# Patient Record
Sex: Female | Born: 1990 | Race: Black or African American | Hispanic: No | Smoking: Former smoker
Health system: Southern US, Community
[De-identification: ages and names within clinical notes are randomized; demographics above are authoritative.]

## PROBLEM LIST (undated history)

## (undated) DIAGNOSIS — Z87440 Personal history of urinary (tract) infections: Secondary | ICD-10-CM

## (undated) DIAGNOSIS — D649 Anemia, unspecified: Secondary | ICD-10-CM

## (undated) HISTORY — PX: WISDOM TOOTH EXTRACTION: SHX21

## (undated) HISTORY — PX: OTHER SURGICAL HISTORY: SHX169

---
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
## 2012-09-21 DIAGNOSIS — Z87440 Personal history of urinary (tract) infections: Secondary | ICD-10-CM

## 2012-09-21 DIAGNOSIS — D649 Anemia, unspecified: Secondary | ICD-10-CM

## 2012-09-21 HISTORY — DX: Anemia, unspecified: D64.9

## 2012-09-21 HISTORY — DX: Personal history of urinary (tract) infections: Z87.440

## 2012-10-15 ENCOUNTER — Observation Stay: Payer: Self-pay | Admitting: Obstetrics and Gynecology

## 2012-10-15 LAB — URINALYSIS, COMPLETE
Glucose,UR: NEGATIVE mg/dL (ref 0–75)
Ketone: NEGATIVE
Nitrite: NEGATIVE
Ph: 9 (ref 4.5–8.0)
Protein: 30
RBC,UR: 81 /HPF (ref 0–5)
Specific Gravity: 1.011 (ref 1.003–1.030)

## 2012-10-15 LAB — CBC WITH DIFFERENTIAL/PLATELET
Basophil %: 0.7 %
Eosinophil %: 0.2 %
HCT: 35 % (ref 35.0–47.0)
HGB: 11.7 g/dL — ABNORMAL LOW (ref 12.0–16.0)
Lymphocyte %: 11.7 %
MCHC: 33.4 g/dL (ref 32.0–36.0)
MCV: 90 fL (ref 80–100)
Monocyte %: 5.8 %
Neutrophil #: 8.9 10*3/uL — ABNORMAL HIGH (ref 1.4–6.5)
Platelet: 241 10*3/uL (ref 150–440)
RBC: 3.87 10*6/uL (ref 3.80–5.20)
RDW: 12.4 % (ref 11.5–14.5)
WBC: 10.9 10*3/uL (ref 3.6–11.0)

## 2012-10-20 LAB — URINE CULTURE

## 2013-01-10 ENCOUNTER — Observation Stay: Payer: Self-pay | Admitting: Obstetrics and Gynecology

## 2013-02-14 ENCOUNTER — Inpatient Hospital Stay: Payer: Self-pay

## 2013-02-14 LAB — CBC WITH DIFFERENTIAL/PLATELET
Basophil #: 0.1 10*3/uL (ref 0.0–0.1)
Eosinophil %: 0.1 %
HGB: 12.2 g/dL (ref 12.0–16.0)
Lymphocyte %: 11.8 %
MCH: 28 pg (ref 26.0–34.0)
MCHC: 33.5 g/dL (ref 32.0–36.0)
Neutrophil %: 83 %
Platelet: 215 10*3/uL (ref 150–440)
RBC: 4.35 10*6/uL (ref 3.80–5.20)
RDW: 16 % — ABNORMAL HIGH (ref 11.5–14.5)

## 2013-02-15 LAB — HEMATOCRIT: HCT: 28.8 % — ABNORMAL LOW (ref 35.0–47.0)

## 2014-08-17 ENCOUNTER — Emergency Department: Payer: Self-pay | Admitting: Emergency Medicine

## 2015-01-29 NOTE — H&P (Signed)
L&D Evaluation:  History Expanded:   HPI 24 yo G1P0 at 23 weeks with pain that started at 1300 in RIght Side ans hs persisted  and radiated to right back, severe at times, assoc w urinary frequency.  No n/v/d, CP, SOB, contractions, vag bleeding, or other complaints. [facility] without complication. No h/o recent or recurrent UTI.    Gravida 1    Term 0    Blood Type (Maternal) O positive    EDC 09-Feb-2013    Presents with back pain    Patient's Medical History No Chronic Illness    Patient's Surgical History none    Medications Pre Natal Vitamins    Allergies NKDA    Social History none    Family History Non-Contributory   ROS:   ROS All systems were reviewed.  HEENT, CNS, GI, GU, Respiratory, CV, Renal and Musculoskeletal systems were found to be normal.   Exam:   Vital Signs stable    General no apparent distress    Mental Status clear    Chest clear    Heart normal sinus rhythm, no murmur/gallop/rubs    Abdomen gravid, non-tender    Estimated Fetal Weight Average for gestational age    Back CVAT, (RIGHT)    Edema no edema    FHT normal rate with no decels    Ucx absent    Skin dry   Impression:   Impression PYELONEPHRITIS vs NEPHROLITHIASIS   Plan:   Plan Admit to treat.    Comments 1. IV ABX until no fever or CVAT, then po ABX. 2. ANalgesia. 3. Ultrasound in am to assess kidney function. 4. Risks of stones discussed. 5. Daily FHR measurements. 6. Risks of pyelo in pregnncy, including worsening infection, pneumonia, and death discussed with patient.   Electronic Signatures: Letitia LibraHarris, Niko Jakel Paul (MD)  (Signed 25-Jan-14 22:53)  Authored: L&D Evaluation   Last Updated: 25-Jan-14 22:53 by Letitia LibraHarris, Severus Brodzinski Paul (MD)

## 2015-01-29 NOTE — H&P (Signed)
L&D Evaluation:  History Expanded:  HPI 24 yo G1 w estimated date of confinement 02/09/13 w contractions.  No vaginal bleeding or ROM.  P[facility] Care at [facility].  O+.  Group B Beta Strep. +.   Gravida 1   Term 0   PreTerm 0   Abortion 0   Living 0   Blood Type (Maternal) O positive   Group B Strep Results Maternal (Result >5wks must be treated as unknown) positive   Maternal HIV Negative   Maternal Syphilis Ab Nonreactive   Maternal Varicella Immune   Rubella Results (Maternal) immune   Presents with contractions   Patient's Medical History No Chronic Illness   Patient's Surgical History none   Medications Pre Natal Vitamins   Allergies NKDA   Social History none   Family History Non-Contributory   ROS:  ROS All systems were reviewed.  HEENT, CNS, GI, GU, Respiratory, CV, Renal and Musculoskeletal systems were found to be normal.   Exam:  Vital Signs stable   General no apparent distress   Mental Status clear   Chest clear   Heart normal sinus rhythm, no murmur/gallop/rubs   Abdomen gravid, non-tender   Estimated Fetal Weight Average for gestational age   Back no CVAT   Edema no edema   Pelvic no external lesions, 5/BBOW   Mebranes Intact   FHT normal rate with no decels   Ucx regular   Skin dry   Impression:  Impression active labor   Plan:  Plan EFM/NST, monitor contractions and for cervical change, antibiotics for GBBS prophylaxis   Electronic Signatures: Letitia LibraHarris, Alecia Doi Paul (MD)  (Signed 27-May-14 07:19)  Authored: L&D Evaluation   Last Updated: 27-May-14 07:19 by Letitia LibraHarris, Jeanette Moffatt Paul (MD)

## 2016-01-09 LAB — HM HIV SCREENING LAB: HM HIV Screening: NEGATIVE

## 2016-01-09 LAB — HM PAP SMEAR: HM Pap smear: NEGATIVE

## 2017-09-24 ENCOUNTER — Emergency Department
Admission: EM | Admit: 2017-09-24 | Discharge: 2017-09-24 | Disposition: A | Discharge status: Home / Self Care | Payer: Self-pay | Attending: Student in an Organized Health Care Education/Training Program | Admitting: Student in an Organized Health Care Education/Training Program

## 2017-09-24 ENCOUNTER — Other Ambulatory Visit: Payer: Self-pay

## 2017-09-24 ENCOUNTER — Encounter: Payer: Self-pay | Admitting: Emergency Medicine

## 2017-09-24 DIAGNOSIS — J09X2 Influenza due to identified novel influenza A virus with other respiratory manifestations: Secondary | ICD-10-CM | POA: Insufficient documentation

## 2017-09-24 DIAGNOSIS — J101 Influenza due to other identified influenza virus with other respiratory manifestations: Secondary | ICD-10-CM

## 2017-09-24 DIAGNOSIS — F17200 Nicotine dependence, unspecified, uncomplicated: Secondary | ICD-10-CM | POA: Insufficient documentation

## 2017-09-24 LAB — URINALYSIS, COMPLETE (UACMP) WITH MICROSCOPIC
BACTERIA UA: NONE SEEN
Bilirubin Urine: NEGATIVE
GLUCOSE, UA: NEGATIVE mg/dL
Hgb urine dipstick: NEGATIVE
Ketones, ur: 80 mg/dL — AB
LEUKOCYTES UA: NEGATIVE
Nitrite: NEGATIVE
PROTEIN: 30 mg/dL — AB
SPECIFIC GRAVITY, URINE: 1.025 (ref 1.005–1.030)
pH: 7 (ref 5.0–8.0)

## 2017-09-24 LAB — INFLUENZA PANEL BY PCR (TYPE A & B)
Influenza A By PCR: POSITIVE — AB
Influenza B By PCR: NEGATIVE

## 2017-09-24 LAB — POCT PREGNANCY, URINE: Preg Test, Ur: NEGATIVE

## 2017-09-24 MED ORDER — PSEUDOEPH-BROMPHEN-DM 30-2-10 MG/5ML PO SYRP: 10.0000 mL | ORAL_SOLUTION | Freq: Four times a day (QID) | ORAL | 0 refills | Status: DC | PRN

## 2017-09-24 MED ORDER — ONDANSETRON 4 MG PO TBDP: 4.0000 mg | ORAL_TABLET | Freq: Three times a day (TID) | ORAL | 0 refills | Status: DC | PRN

## 2017-09-24 MED ORDER — ACETAMINOPHEN 325 MG PO TABS
650.0000 mg | ORAL_TABLET | Freq: Once | ORAL | Status: AC | PRN
  Administered 2017-09-24: 650 mg via ORAL
  Filled 2017-09-24: qty 2

## 2017-09-24 MED ORDER — FLUTICASONE PROPIONATE 50 MCG/ACT NA SUSP: 1.0000 | Freq: Two times a day (BID) | NASAL | 0 refills | Status: DC

## 2017-09-24 NOTE — ED Provider Notes (Signed)
[facility] Emergency Department Provider Note  ____________________________________________  Time seen: Approximately 3:39 PM  I have reviewed the triage vital signs and the nursing notes.   HISTORY  Chief Complaint flu like symptoms    HPI Cathy Vaughn is a 27 y.o. female who presents emergency department complaining of headache, sore throat, body aches, nausea, emesis, abdominal cramping starting yesterday.  Patient reports that she has a slightly runny nose but no significant nasal congestion.  She does have a slight cough.  Patient denies any visual changes, neck pain or stiffness, chest pain, shortness of breath.  No diarrhea or constipation at this time.  Patient denies any dysuria, polyuria, hematuria.  Patient does not take any medications for this complaint.  No other complaints at this time.  History reviewed. No pertinent past medical history.  There are no active problems to display for this patient.   History reviewed. No pertinent surgical history.  Prior to Admission medications   Medication Sig Start Date End Date Taking? Authorizing Provider  brompheniramine-pseudoephedrine-DM 30-2-10 MG/5ML syrup Take 10 mLs by mouth 4 (four) times daily as needed. 09/24/17   Cuthriell, Delorise Royals, PA-C  fluticasone (FLONASE) 50 MCG/ACT nasal spray Place 1 spray into both nostrils 2 (two) times daily. 09/24/17   Cuthriell, Delorise Royals, PA-C  ondansetron (ZOFRAN-ODT) 4 MG disintegrating tablet Take 1 tablet (4 mg total) by mouth every 8 (eight) hours as needed for nausea or vomiting. 09/24/17   Cuthriell, Delorise Royals, PA-C    Allergies Patient has no known allergies.  History reviewed. No pertinent family history.  Social History Social History   Tobacco Use  . Smoking status: Current Every Day Smoker  . Smokeless tobacco: Never Used  Substance Use Topics  . Alcohol use: Yes    Frequency: Never  . Drug use: No     Review of Systems   Constitutional: Positive fever/chills Eyes: No visual changes. No discharge ENT: Positive for mild and sore throat. Cardiovascular: no chest pain. Respiratory: Positive cough. No SOB. Gastrointestinal: No abdominal pain.  Positive for abdominal cramps.  Positive for nausea and emesis.  No diarrhea.  No constipation. Genitourinary: Negative for dysuria. No hematuria Musculoskeletal: Negative for musculoskeletal pain. Skin: Negative for rash, abrasions, lacerations, ecchymosis. Neurological: Negative for headaches, focal weakness or numbness. 10-point ROS otherwise negative.  ____________________________________________   PHYSICAL EXAM:  VITAL SIGNS: ED Triage Vitals  Enc Vitals Group     BP 09/24/17 1334 (!) 112/58     Pulse Rate 09/24/17 1334 (!) 110     Resp 09/24/17 1334 20     Temp 09/24/17 1334 (!) 101.5 F (38.6 C)     Temp Source 09/24/17 1334 Oral     SpO2 09/24/17 1334 100 %     Weight 09/24/17 1333 110 lb (49.9 kg)     Height 09/24/17 1333 5\' 5"  (1.651 m)     Head Circumference --      Peak Flow --      Pain Score 09/24/17 1332 9     Pain Loc --      Pain Edu? --      Excl. in GC? --      Constitutional: Alert and oriented. Well appearing and in no acute distress. Eyes: Conjunctivae are normal. PERRL. EOMI. Head: Atraumatic. ENT:      Ears: EACs and TMs unremarkable bilaterally.      Nose: Minimal clear congestion/rhinnorhea.      Mouth/Throat: Mucous membranes are moist.  Oropharynx  is mildly erythematous but nonedematous.  Uvula is midline. Neck: No stridor.  Neck is supple full range of motion Hematological/Lymphatic/Immunilogical: No cervical lymphadenopathy. Cardiovascular: Normal rate, regular rhythm. Normal S1 and S2.  Good peripheral circulation. Respiratory: Normal respiratory effort without tachypnea or retractions. Lungs CTAB. Good air entry to the bases with no decreased or absent breath sounds. Gastrointestinal: Bowel sounds 4 quadrants. Soft  and nontender to palpation. No guarding or rigidity. No palpable masses. No distention. No CVA tenderness. Musculoskeletal: Full range of motion to all extremities. No gross deformities appreciated. Neurologic:  Normal speech and language. No gross focal neurologic deficits are appreciated.  Skin:  Skin is warm, dry and intact. No rash noted. Psychiatric: Mood and affect are normal. Speech and behavior are normal. Patient exhibits appropriate insight and judgement.   ____________________________________________   LABS (all labs ordered are listed, but only abnormal results are displayed)  Labs Reviewed  INFLUENZA PANEL BY PCR (TYPE A & B) - Abnormal; Notable for the following components:      Result Value   Influenza A By PCR POSITIVE (*)    All other components within normal limits  URINALYSIS, COMPLETE (UACMP) WITH MICROSCOPIC - Abnormal; Notable for the following components:   Color, Urine YELLOW (*)    APPearance HAZY (*)    Ketones, ur 80 (*)    Protein, ur 30 (*)    Squamous Epithelial / LPF 0-5 (*)    All other components within normal limits  POCT PREGNANCY, URINE  POC URINE PREG, ED   ____________________________________________  EKG   ____________________________________________  RADIOLOGY   No results found.  ____________________________________________    PROCEDURES  Procedure(s) performed:    Procedures    Medications  acetaminophen (TYLENOL) tablet 650 mg (650 mg Oral Given 09/24/17 1356)     ____________________________________________   INITIAL IMPRESSION / ASSESSMENT AND PLAN / ED COURSE  Pertinent labs & imaging results that were available during my care of the patient were reviewed by me and considered in my medical decision making (see chart for details).  Review of the [place] CSRS was performed in accordance of the [facility] prior to dispensing any controlled drugs.  Clinical Course as of Sep 25 1631  [name] Sep 24, 2017  1541 Patient  presents the emergency department with 1 day history of fevers and chills, nasal congestion, sore throat, coughing, nausea and emesis, abdominal cramping.  Initial differential includes influenza versus neurovirus versus upper respiratory viral infection versus UTI.  Influenza testing is ordered.  Urinalysis ordered.  [JC]    Clinical Course User Index [JC] Cuthriell, Delorise Royals, PA-C    Patient's diagnosis is consistent with influenza.  Patient presented with fevers, chills, nasal congestion, sore throat, nausea and vomiting.  Patient was positive for influenza A on testing.  After discussion about Tamiflu, patient declines.. Patient will be discharged home with prescriptions for Zofran, Flonase, Bromfed.  Patient is to drink plenty of fluids, take Tylenol Motrin at home.. Patient is to follow up with primary care as needed or otherwise directed. Patient is given ED precautions to return to the ED for any worsening or new symptoms.     ____________________________________________  FINAL CLINICAL IMPRESSION(S) / ED DIAGNOSES  Final diagnoses:  Influenza A      NEW MEDICATIONS STARTED DURING THIS VISIT:  ED Discharge Orders        Ordered    ondansetron (ZOFRAN-ODT) 4 MG disintegrating tablet  Every 8 hours PRN     09/24/17 1630  fluticasone (FLONASE) 50 MCG/ACT nasal spray  2 times daily     09/24/17 1630    brompheniramine-pseudoephedrine-DM 30-2-10 MG/5ML syrup  4 times daily PRN     09/24/17 1630          This chart was dictated using voice recognition software/Dragon. Despite best efforts to proofread, errors can occur which can change the meaning. Any change was purely unintentional.    Racheal PatchesCuthriell, Jonathan D, PA-C 09/24/17 1634    Willy Eddyobinson, Patrick, MD 09/24/17 [id number]

## 2017-09-24 NOTE — ED Notes (Addendum)
Sore throat, headache, body aches that started last night. Denies nasal congestion or productive cough. Pt alert and oriented X4, active, cooperative, pt in NAD. RR even and unlabored, color WNL.

## 2017-09-24 NOTE — ED Triage Notes (Signed)
Generalized body aches since yesterday with cough and sore throat. No fever. Not taken anything OTC.

## 2017-09-24 NOTE — ED Notes (Signed)
Pt alert and oriented X4, active, cooperative, pt in NAD. RR even and unlabored, color WNL.  Pt informed to return if any life threatening symptoms occur.  Discharge and followup instructions reviewed.  

## 2019-04-09 ENCOUNTER — Other Ambulatory Visit: Payer: Self-pay

## 2019-04-09 DIAGNOSIS — F17200 Nicotine dependence, unspecified, uncomplicated: Secondary | ICD-10-CM | POA: Diagnosis not present

## 2019-04-09 DIAGNOSIS — Z79899 Other long term (current) drug therapy: Secondary | ICD-10-CM | POA: Diagnosis not present

## 2019-04-09 DIAGNOSIS — O210 Mild hyperemesis gravidarum: Secondary | ICD-10-CM | POA: Diagnosis not present

## 2019-04-09 DIAGNOSIS — O99331 Smoking (tobacco) complicating pregnancy, first trimester: Secondary | ICD-10-CM | POA: Insufficient documentation

## 2019-04-09 DIAGNOSIS — K226 Gastro-esophageal laceration-hemorrhage syndrome: Secondary | ICD-10-CM | POA: Diagnosis not present

## 2019-04-09 DIAGNOSIS — O9989 Other specified diseases and conditions complicating pregnancy, childbirth and the puerperium: Secondary | ICD-10-CM | POA: Diagnosis not present

## 2019-04-09 DIAGNOSIS — Z3A08 8 weeks gestation of pregnancy: Secondary | ICD-10-CM | POA: Diagnosis not present

## 2019-04-09 LAB — URINALYSIS, COMPLETE (UACMP) WITH MICROSCOPIC
Bilirubin Urine: NEGATIVE
Glucose, UA: NEGATIVE mg/dL
Hgb urine dipstick: NEGATIVE
Ketones, ur: 20 mg/dL — AB
Nitrite: NEGATIVE
Protein, ur: NEGATIVE mg/dL
Specific Gravity, Urine: 1.014 (ref 1.005–1.030)
pH: 6 (ref 5.0–8.0)

## 2019-04-09 LAB — COMPREHENSIVE METABOLIC PANEL
ALT: 23 U/L (ref 0–44)
AST: 25 U/L (ref 15–41)
Albumin: 4.7 g/dL (ref 3.5–5.0)
Alkaline Phosphatase: 50 U/L (ref 38–126)
Anion gap: 10 (ref 5–15)
BUN: 9 mg/dL (ref 6–20)
CO2: 22 mmol/L (ref 22–32)
Calcium: 9.3 mg/dL (ref 8.9–10.3)
Chloride: 103 mmol/L (ref 98–111)
Creatinine, Ser: 0.43 mg/dL — ABNORMAL LOW (ref 0.44–1.00)
GFR calc Af Amer: >60 mL/min (ref >60)
GFR calc non Af Amer: >60 mL/min (ref >60)
Glucose, Bld: 75 mg/dL (ref 70–99)
Potassium: 3.3 mmol/L — ABNORMAL LOW (ref 3.5–5.1)
Sodium: 135 mmol/L (ref 135–145)
Total Bilirubin: 0.7 mg/dL (ref 0.3–1.2)
Total Protein: 7.6 g/dL (ref 6.5–8.1)

## 2019-04-09 LAB — CBC
HCT: 39.1 % (ref 36.0–46.0)
Hemoglobin: 13.6 g/dL (ref 12.0–15.0)
MCH: 30.8 pg (ref 26.0–34.0)
MCHC: 34.8 g/dL (ref 30.0–36.0)
MCV: 88.5 fL (ref 80.0–100.0)
Platelets: 286 10*3/uL (ref 150–400)
RBC: 4.42 MIL/uL (ref 3.87–5.11)
RDW: 11.9 % (ref 11.5–15.5)
WBC: 8.4 10*3/uL (ref 4.0–10.5)
nRBC: 0 % (ref 0.0–0.2)

## 2019-04-09 LAB — POCT PREGNANCY, URINE: Preg Test, Ur: POSITIVE — AB

## 2019-04-09 LAB — LIPASE, BLOOD: Lipase: 34 U/L (ref 11–51)

## 2019-04-09 NOTE — ED Triage Notes (Signed)
Patient reports being approximately [redacted] weeks pregnant.  States has had "usual" vomiting with pregnancy, but over past couple days more fatigued than usual, vomiting had increased and stated vomited blood approximately an hour ago.

## 2019-04-10 ENCOUNTER — Emergency Department
Admission: EM | Admit: 2019-04-10 | Discharge: 2019-04-10 | Disposition: A | Discharge status: Home / Self Care | Payer: Medicaid Other | Attending: Emergency Medicine | Admitting: Emergency Medicine

## 2019-04-10 DIAGNOSIS — K226 Gastro-esophageal laceration-hemorrhage syndrome: Secondary | ICD-10-CM

## 2019-04-10 DIAGNOSIS — O210 Mild hyperemesis gravidarum: Secondary | ICD-10-CM

## 2019-04-10 MED ORDER — DEXTROSE IN LACTATED RINGERS 5 % IV SOLN
INTRAVENOUS | Status: DC
  Administered 2019-04-10: 01:00:00 via INTRAVENOUS

## 2019-04-10 MED ORDER — DEXTROSE 5 % AND 0.9 % NACL IV BOLUS: 1000.0000 mL | Freq: Once | INTRAVENOUS | Status: DC

## 2019-04-10 MED ORDER — DEXTROSE-NACL 5-0.9 % IV SOLN: Freq: Once | INTRAVENOUS | Status: DC

## 2019-04-10 MED ORDER — PROMETHAZINE HCL 25 MG/ML IJ SOLN
12.5000 mg | Freq: Once | INTRAMUSCULAR | Status: AC
  Administered 2019-04-10: 01:00:00 12.5 mg via INTRAVENOUS

## 2019-04-10 MED ORDER — ONDANSETRON 4 MG PO TBDP: ORAL_TABLET | ORAL | 0 refills | Status: DC

## 2019-04-10 MED ORDER — ONDANSETRON HCL 4 MG/2ML IJ SOLN
4.0000 mg | INTRAMUSCULAR | Status: AC
  Administered 2019-04-10: 03:00:00 4 mg via INTRAVENOUS
  Filled 2019-04-10: qty 2

## 2019-04-10 NOTE — ED Notes (Signed)
Responded to call bell. Pt requested warm blanket and pillow. Pillow and blanket provided. Pt resting comfortably.

## 2019-04-10 NOTE — Discharge Instructions (Addendum)
You were evaluated today for nausea and vomiting during pregnancy.  It is safe for you to go home and follow-up as an outpatient since you are feeling better.  Please take your regular medications (unless otherwise canceled in these papers) and any medications prescribed today according to the written instructions.  Return to the emergency department if you develop any new or worsening symptoms that concern you.  

## 2019-04-10 NOTE — ED Notes (Signed)
Pt states she is not feeling improved. md notified.

## 2019-04-10 NOTE — ED Provider Notes (Signed)
[facility] Emergency Department Provider Note  ____________________________________________   First MD Initiated Contact with Patient 04/10/19 0019     (approximate)  I have reviewed the triage vital signs and the nursing notes.   HISTORY  Chief Complaint Emesis    HPI Cathy Vaughn is a 28 y.o. female G2, P1 at approximately [redacted] weeks gestation based on the date of her last menstrual period.  She presents tonight for persistent vomiting over the last couple of days it is gradually gotten worse.  She said that she has had nausea and vomiting throughout the pregnancy thus far but is gotten worse over the last few days and she feels generally weak and a little bit lightheaded as well, she thinks because she has not been able to keep anything down recently.  This evening she was throwing up quite violently, doing a lot of retching and gagging, and she noticed blood mixed in with the frothy material that was coming out.  She vomited again subsequently a few hours later and only a little bit of blood at this time but it scared her.  She has no history of alcohol abuse and no bleeding or clotting disorders of which she is aware.  She denies fever, sore throat until all the vomiting occurred and now her throat is little bit raw, chest pain, cough, shortness of breath.  Her stomach feels a little bit achy after the vomiting but she is not having any abdominal pain per se.  She denies vaginal bleeding.  She has not vomited for a couple of hours.  She describes the symptoms as severe and gradually getting worse over time.  She did not have a similar experience with her first pregnancy.  Nothing particular makes the symptoms better or worse.  She plans to establish care at the health department within the next week and also is planning to go to [place] because she works in [place].         No past medical history on file.  There are no active problems to  display for this patient.   No past surgical history on file.  Prior to Admission medications   Medication Sig Start Date End Date Taking? Authorizing Provider  brompheniramine-pseudoephedrine-DM 30-2-10 MG/5ML syrup Take 10 mLs by mouth 4 (four) times daily as needed. 09/24/17   Cuthriell, Charline Bills, PA-C  fluticasone (FLONASE) 50 MCG/ACT nasal spray Place 1 spray into both nostrils 2 (two) times daily. 09/24/17   Cuthriell, Charline Bills, PA-C  ondansetron (ZOFRAN-ODT) 4 MG disintegrating tablet Take 1 tablet (4 mg total) by mouth every 8 (eight) hours as needed for nausea or vomiting. 09/24/17   Cuthriell, Charline Bills, PA-C    Allergies Patient has no known allergies.  Family History  Problem Relation Age of Onset  . Hypertension Mother   . Alcohol abuse Father   . Diabetes Maternal Grandmother   . Diabetes Maternal Grandfather   . Lung cancer Maternal Grandfather     Social History Social History   Tobacco Use  . Smoking status: Current Every Day Smoker  . Smokeless tobacco: Never Used  Substance Use Topics  . Alcohol use: Yes    Frequency: Never  . Drug use: No    Review of Systems Constitutional: General fatigue and malaise.  No fever/chills Eyes: No visual changes. ENT: No sore throat. Cardiovascular: Denies chest pain. Respiratory: Denies shortness of breath. Gastrointestinal: Worsening nausea and vomiting with some blood mixed with her emesis  this evening.  Aching abdominal pain after multiple episodes of vomiting but that sharp pain. Genitourinary: No vaginal bleeding.  Negative for dysuria. Musculoskeletal: Negative for neck pain.  Negative for back pain. Integumentary: Negative for rash. Neurological: Negative for headaches, focal weakness or numbness.   ____________________________________________   PHYSICAL EXAM:  VITAL SIGNS: ED Triage Vitals  Enc Vitals Group     BP 04/09/19 1926 (!) 102/51     Pulse Rate 04/09/19 1926 84     Resp 04/09/19 1926 18      Temp 04/09/19 1926 99 F (37.2 C)     Temp Source 04/09/19 1926 Oral     SpO2 04/09/19 1926 100 %     Weight 04/09/19 1927 49.9 kg (110 lb)     Height 04/09/19 1927 1.626 m (5\' 4" )     Head Circumference --      Peak Flow --      Pain Score 04/09/19 1927 6     Pain Loc --      Pain Edu? --      Excl. in GC? --     Constitutional: Alert and oriented. Well appearing and in no acute distress. Eyes: Conjunctivae are normal.  Head: Atraumatic. Nose: No congestion/rhinnorhea. Mouth/Throat: Mucous membranes are moist. Neck: No stridor.  No meningeal signs.   Cardiovascular: Normal rate, regular rhythm. Good peripheral circulation. Grossly normal heart sounds. Respiratory: Normal respiratory effort.  No retractions. No audible wheezing. Gastrointestinal: Soft and nontender. No distention.  GU:  deferred - not indicated Musculoskeletal: No lower extremity tenderness nor edema. No gross deformities of extremities. Neurologic:  Normal speech and language. No gross focal neurologic deficits are appreciated.  Skin:  Skin is warm, dry and intact. No rash noted. Psychiatric: Mood and affect are normal. Speech and behavior are normal.  ____________________________________________   LABS (all labs ordered are listed, but only abnormal results are displayed)  Labs Reviewed  COMPREHENSIVE METABOLIC PANEL - Abnormal; Notable for the following components:      Result Value   Potassium 3.3 (*)    Creatinine, Ser 0.43 (*)    All other components within normal limits  URINALYSIS, COMPLETE (UACMP) WITH MICROSCOPIC - Abnormal; Notable for the following components:   Color, Urine YELLOW (*)    APPearance HAZY (*)    Ketones, ur 20 (*)    Leukocytes,Ua SMALL (*)    Bacteria, UA RARE (*)    All other components within normal limits  POCT PREGNANCY, URINE - Abnormal; Notable for the following components:   Preg Test, Ur POSITIVE (*)    All other components within normal limits  LIPASE, BLOOD   CBC  POC URINE PREG, ED   ____________________________________________  EKG  No indication for EKG ____________________________________________  RADIOLOGY   ED MD interpretation: Indication for imaging  Official radiology report(s): No results found.  ____________________________________________   PROCEDURES   Procedure(s) performed (including Critical Care):  Procedures   ____________________________________________   INITIAL IMPRESSION / MDM / ASSESSMENT AND PLAN / ED COURSE  As part of my medical decision making, I reviewed the following data within the electronic MEDICAL RECORD NUMBER Nursing notes reviewed and incorporated, Labs reviewed , Old chart reviewed and Notes from prior ED visits   Differential diagnosis includes, but is not limited to, hyperemesis gravidarum, Mallory-Weiss tear, less likely esophageal varices or acute intra-abdominal infection.  The patient is well-appearing in no distress.  Vital signs are stable and she is not tachycardic.  She has some ketones  in her urine which I would expect and her potassium is very slightly low.  Otherwise lab work is reassuring and urine pregnancy test is positive as expected.  She has no GU complaints at this time and there is no indication for an ultrasound.  We discussed it and decided that I would give her some IV fluids I think it would be appropriate to give D5 normal saline 1 L over an hour by IV as well as Phenergan 12.5 mg IV.  I think this will be more helpful to her in terms of controlling her nausea and may also help her get some rest.  Anticipate discharge with OB/GYN follow-up and a prescription for antiemetics.  She understands and agrees with this plan.  No indication for imaging at this time.  I explained to her about Mallory-Weiss tears in the setting of forceful vomiting and she understands.  She is not at high risk for esophageal varices.      Clinical Course as of Apr 09 242  Mon Apr 10, 2019  [id number]  Patient feels better, no additional vomiting.  She is tired and ready to go home.  I gave my usual customary return precautions and a prescription for Zofran.   [CF]    Clinical Course User Index [CF] Loleta RoseForbach, Lyriq Jarchow, MD     ____________________________________________  FINAL CLINICAL IMPRESSION(S) / ED DIAGNOSES  Final diagnoses:  Hyperemesis gravidarum  Mallory-Weiss syndrome     MEDICATIONS GIVEN DURING THIS VISIT:  Medications  dextrose 5 % in lactated ringers infusion ( Intravenous New Bag/Given 04/10/19 0116)  ondansetron (ZOFRAN) injection 4 mg (has no administration in time range)  promethazine (PHENERGAN) injection 12.5 mg (12.5 mg Intravenous Given 04/10/19 0114)     ED Discharge Orders    None      *Please note:  Lynnae SandhoffBrianna Boughner was evaluated in Emergency Department on 04/10/2019 for the symptoms described in the history of present illness. She was evaluated in the context of the global COVID-19 pandemic, which necessitated consideration that the patient might be at risk for infection with the SARS-CoV-2 virus that causes COVID-19. Institutional protocols and algorithms that pertain to the evaluation of patients at risk for COVID-19 are in a state of rapid change based on information released by regulatory bodies including the CDC and federal and state organizations. These policies and algorithms were followed during the patient's care in the ED.  Some ED evaluations and interventions may be delayed as a result of limited staffing during the pandemic.*  Note:  This document was prepared using Dragon voice recognition software and may include unintentional dictation errors.   Loleta RoseForbach, Robbyn Hodkinson, MD 04/10/19 [phone]

## 2019-04-10 NOTE — ED Notes (Signed)
Bed repositioned for comfort, phone at side, call bell at side. Lights dimmed for comfort.

## 2019-04-10 NOTE — ED Notes (Addendum)
Pt states she is approx [redacted] weeks pregnant. Pt states she has had nausea and vomiting this pregnancy and is not able to keep fluids or food down. Pt states she feels weak and has no energy. Pt states she has been loosing weight and at times has intermittent chest heaviness. Pt states tonight while vomiting had BrB in emesis once. Pt states she is constipated.

## 2019-04-12 ENCOUNTER — Other Ambulatory Visit: Payer: Self-pay | Admitting: Family Medicine

## 2019-04-12 ENCOUNTER — Other Ambulatory Visit: Payer: Self-pay

## 2019-04-12 ENCOUNTER — Ambulatory Visit (LOCAL_COMMUNITY_HEALTH_CENTER): Payer: Self-pay

## 2019-04-12 VITALS — BP 106/67 | Ht 62.0 in | Wt 110.0 lb

## 2019-04-12 DIAGNOSIS — Z3201 Encounter for pregnancy test, result positive: Secondary | ICD-10-CM

## 2019-04-12 LAB — PREGNANCY, URINE: Preg Test, Ur: POSITIVE — AB

## 2019-04-12 MED ORDER — PRENATAL VITAMIN 27-0.8 MG PO TABS: 1.0000 | ORAL_TABLET | Freq: Every day | ORAL | 0 refills | Status: AC

## 2019-04-18 ENCOUNTER — Encounter: Payer: Self-pay | Admitting: Emergency Medicine

## 2019-04-18 ENCOUNTER — Emergency Department: Payer: Medicaid Other

## 2019-04-18 ENCOUNTER — Emergency Department
Admission: EM | Admit: 2019-04-18 | Discharge: 2019-04-18 | Disposition: A | Discharge status: Home / Self Care | Payer: Medicaid Other | Attending: Emergency Medicine | Admitting: Emergency Medicine

## 2019-04-18 ENCOUNTER — Other Ambulatory Visit: Payer: Self-pay

## 2019-04-18 DIAGNOSIS — O9A219 Injury, poisoning and certain other consequences of external causes complicating pregnancy, unspecified trimester: Secondary | ICD-10-CM | POA: Insufficient documentation

## 2019-04-18 DIAGNOSIS — Y999 Unspecified external cause status: Secondary | ICD-10-CM | POA: Diagnosis not present

## 2019-04-18 DIAGNOSIS — Z3A00 Weeks of gestation of pregnancy not specified: Secondary | ICD-10-CM | POA: Insufficient documentation

## 2019-04-18 DIAGNOSIS — Y92009 Unspecified place in unspecified non-institutional (private) residence as the place of occurrence of the external cause: Secondary | ICD-10-CM | POA: Insufficient documentation

## 2019-04-18 DIAGNOSIS — Z87891 Personal history of nicotine dependence: Secondary | ICD-10-CM | POA: Diagnosis not present

## 2019-04-18 DIAGNOSIS — S62666A Nondisplaced fracture of distal phalanx of right little finger, initial encounter for closed fracture: Secondary | ICD-10-CM | POA: Diagnosis not present

## 2019-04-18 DIAGNOSIS — S6991XA Unspecified injury of right wrist, hand and finger(s), initial encounter: Secondary | ICD-10-CM | POA: Diagnosis present

## 2019-04-18 DIAGNOSIS — Z79899 Other long term (current) drug therapy: Secondary | ICD-10-CM | POA: Insufficient documentation

## 2019-04-18 DIAGNOSIS — Y9389 Activity, other specified: Secondary | ICD-10-CM | POA: Diagnosis not present

## 2019-04-18 DIAGNOSIS — S90112A Contusion of left great toe without damage to nail, initial encounter: Secondary | ICD-10-CM

## 2019-04-18 NOTE — Discharge Instructions (Signed)
Call make an appoint with Dr. Harlow Mares to follow-up for your fractured fifth finger.  You may use ice and elevate to reduce swelling and help with pain.  Wear splint for protection and support.  You may take Tylenol if needed for pain.  Avoid any anti-inflammatories while being pregnant.  Wear metal splint on your finger until you have been seen by the orthopedist and advised otherwise.
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
## 2019-06-08 NOTE — Addendum Note (Signed)
Addended by: Cletis Media on: 06/08/2019 09:27 AM   Modules accepted: Orders

## 2020-11-25 IMAGING — DX LEFT GREAT TOE
3 series · 3 of 3 positions shown · non-contrast
Comparison: No recent.

CLINICAL DATA: Injury.

EXAM:
LEFT GREAT TOE

[toe ap]
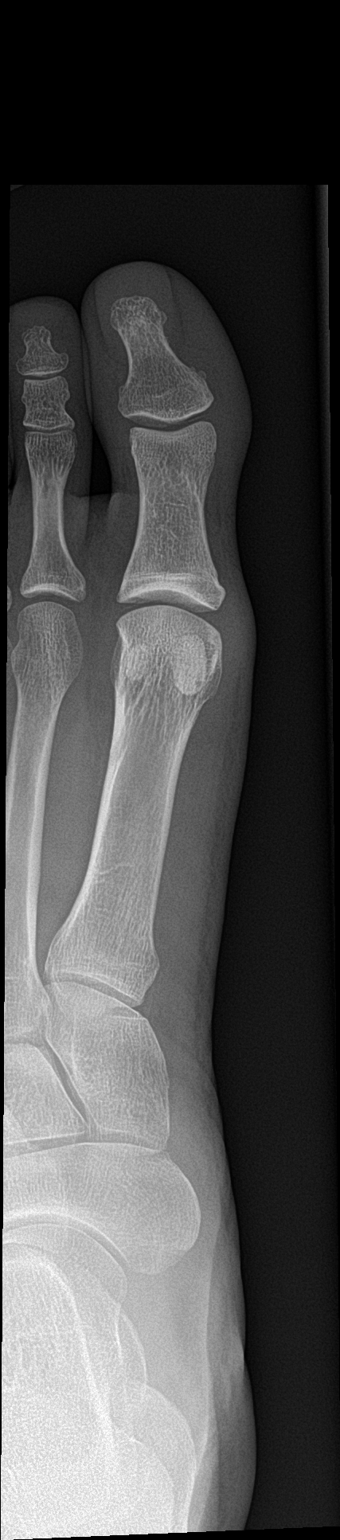

[toe obl]
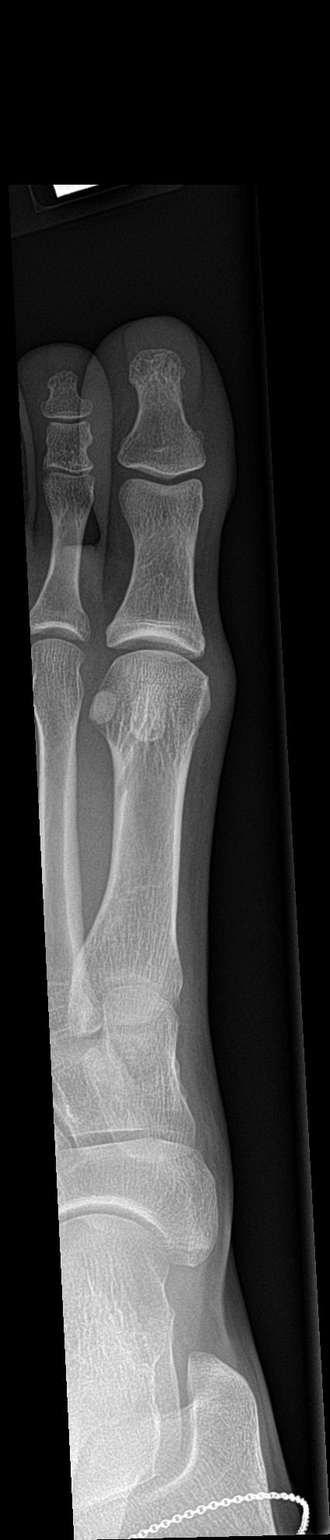

[toe lat]
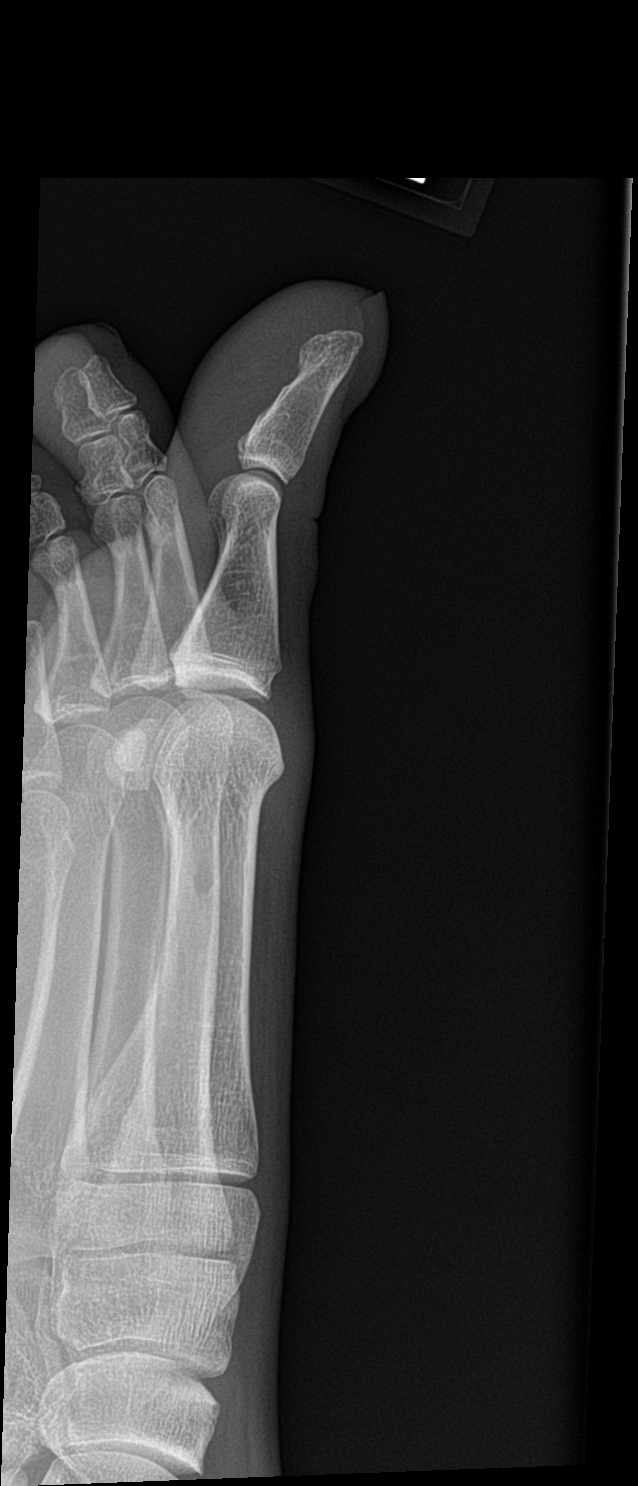

[3 of 3 positions shown; findings below may reference images not displayed]

FINDINGS: No acute soft tissue bony abnormality.  No radiopaque foreign body.
IMPRESSION: No acute or focal abnormality.
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
## 2024-03-21 ENCOUNTER — Ambulatory Visit (LOCAL_COMMUNITY_HEALTH_CENTER)

## 2024-03-21 VITALS — BP 117/56 | Ht 64.0 in | Wt 125.0 lb

## 2024-03-21 DIAGNOSIS — Z3201 Encounter for pregnancy test, result positive: Secondary | ICD-10-CM

## 2024-03-21 DIAGNOSIS — Z309 Encounter for contraceptive management, unspecified: Secondary | ICD-10-CM | POA: Diagnosis not present

## 2024-03-21 LAB — PREGNANCY, URINE: Preg Test, Ur: POSITIVE — AB

## 2024-03-21 MED ORDER — PRENATAL 27-0.8 MG PO TABS: 1.0000 | ORAL_TABLET | Freq: Every day | ORAL | Status: DC

## 2024-03-21 NOTE — Progress Notes (Signed)
 UPT positive. Plans to receive prenatal care at [facility] [place] location; encouraged to establish care ASAP. List of local providers given to patient.   The patient was dispensed prenatal vitamins #100 today. I provided counseling today regarding the medication. We discussed the medication, the side effects and when to call clinic.   Positive pregnancy packet reviewed and given to patient. Also counseled on hydration and when to seek medical attention.  All questions answered and verbalizes understanding.   Doyce CINDERELLA Shuck, RN

## 2024-04-14 ENCOUNTER — Telehealth: Payer: Self-pay | Admitting: Advanced Practice Midwife

## 2024-04-14 ENCOUNTER — Telehealth

## 2024-04-14 NOTE — Telephone Encounter (Signed)
 Reached out to pt to reschedule NOB Nurse Intake appt that was scheduled on 04/14/2024 at 3:15.  Left message for pt to call back to reschedule.

## 2024-04-17 NOTE — Telephone Encounter (Signed)
 Pt is scheduled for NOB intake on 04/24/2024 at 10:15.

## 2024-04-24 ENCOUNTER — Telehealth

## 2024-04-24 DIAGNOSIS — Z3689 Encounter for other specified antenatal screening: Secondary | ICD-10-CM

## 2024-04-24 DIAGNOSIS — Z3687 Encounter for antenatal screening for uncertain dates: Secondary | ICD-10-CM

## 2024-04-24 DIAGNOSIS — Z3480 Encounter for supervision of other normal pregnancy, unspecified trimester: Secondary | ICD-10-CM | POA: Insufficient documentation

## 2024-04-24 NOTE — Progress Notes (Signed)
 New OB Intake  I connected with  Cathy Vaughn Base on 04/24/24 at 10:15 AM EDT by MyChart Video Visit and verified that I am speaking with the correct person using two identifiers. Nurse is located at [facility] and pt is located at home.  I discussed the limitations, risks, security and privacy concerns of performing an evaluation and management service by telephone and the availability of in person appointments. I also discussed with the patient that there may be a patient responsible charge related to this service. The patient expressed understanding and agreed to proceed.  I explained I am completing New OB Intake today. We discussed her EDD of 11/26/24 that is based on LMP of 02/20/24. Pt is G5/P2. I reviewed her allergies, medications, Medical/Surgical/OB history, and appropriate screenings. There are cats in the home: no. Based on history, this is a/an pregnancy uncomplicated . Her obstetrical history is significant for N/A.  Patient Active Problem List   Diagnosis Date Noted   Supervision of other normal pregnancy, antepartum 04/24/2024    Concerns addressed today: None  Delivery Plans:  Plans to deliver at [facility].  Anatomy US  Explained first scheduled US  will be 04/26/24. Anatomy US  will be scheduled around [redacted] weeks gestational age.  Labs Discussed genetic screening with patient. Patient undecided about genetic testing to be drawn at new OB visit. Discussed possible labs to be drawn at new OB appointment.  COVID Vaccine Patient has had 2 COVID vaccines.   Social Determinants of Health Food Insecurity: expresses food insecurity. Information given on local food banks. Transportation: Patient denies transportation needs. Childcare: Discussed no children allowed at ultrasound appointments.   First visit review I reviewed new OB appt with pt. I explained she will have blood work and pap smear/pelvic exam if indicated. Explained pt will be seen by  Slater Rains, CNM at first visit; encounter routed to appropriate provider.   Beola Skeens, CMA 04/24/2024  10:39 AM

## 2024-04-24 NOTE — Patient Instructions (Signed)
 First Trimester of Pregnancy  The first trimester of pregnancy starts on the first day of your last monthly period until the end of week 13. This is months 1 through 3 of pregnancy. A week after a sperm fertilizes an egg, the egg will implant into the wall of the uterus and begin to develop into a baby. Body changes during your first trimester Your body goes through many changes during pregnancy. The changes usually return to normal after your baby is born. Physical changes Your breasts may grow larger and may hurt. The area around your nipples may get darker. Your periods will stop. Your hair and nails may grow faster. You may pee more often. Health changes You may tire easily. Your gums may bleed and may be sensitive when you brush and floss. You may not feel hungry. You may have heartburn. You may throw up or feel like you may throw up. You may want to eat some foods, but not others. You may have headaches. You may have trouble pooping (constipation). Other changes Your emotions may change from day to day. You may have more dreams. Follow these instructions at home: Medicines Talk to your health care provider if you're taking medicines. Ask if the medicines are safe to take during pregnancy. Your provider may change the medicines that you take. Do not take any medicines unless told to by your provider. Take a prenatal vitamin that has at least 600 micrograms (mcg) of folic acid. Do not use herbal medicines, illegal substances, or medicines that are not approved by your provider. Eating and drinking While you're pregnant your body needs extra food for your growing baby. Talk with your provider about what to eat while pregnant. Activity Most women are able to exercise during pregnancy. Exercises may need to change as your pregnancy goes on. Talk to your provider about your activities and exercise routines. Relieving pain and discomfort Wear a good, supportive bra if your breasts  hurt. Rest with your legs raised if you have leg cramps or low back pain. Safety Wear your seatbelt at all times when you're in a car. Talk to your provider if someone hits you, hurts you, or yells at you. Talk with your provider if you're feeling sad or have thoughts of hurting yourself. Lifestyle Certain things can be harmful while you're pregnant. Follow these rules: Do not use hot tubs, steam rooms, or saunas. Do not douche. Do not use tampons or scented pads. Do not drink alcohol,smoke, vape, or use products with nicotine or tobacco in them. If you need help quitting, talk with your provider. Avoid cat litter boxes and soil used by cats. These things carry germs that can cause harm to your pregnancy and your baby. General instructions Keep all follow-up visits. It helps you and your unborn baby stay as healthy as possible. Write down your questions. Take them to your visits. Your provider will: Talk with you about your overall health. Give you advice or refer you to specialists who can help with different needs, including: Prenatal education classes. Mental health and counseling. Foods and healthy eating. Ask for help if you need help with food. Call your dentist and ask to be seen. Brush your teeth with a soft toothbrush. Floss gently. Where to find more information American Pregnancy Association: americanpregnancy.org [employer]: acog.org Office on [employer] Health: TravelLesson.ca Contact a health care provider if: You feel dizzy, faint, or have a fever. You vomit or have watery poop (diarrhea) for 2  days or more. You have abnormal discharge or bleeding from your vagina. You have pain when you pee or your pee smells bad. You have cramps, pain, or pressure in your belly area. Get help right away if: You have trouble breathing or chest pain. You have any kind of injury, such as from a fall or a car crash. These symptoms may be an  emergency. Get help right away. Call 911. Do not wait to see if the symptoms will go away. Do not drive yourself to the hospital. This information is not intended to replace advice given to you by your health care provider. Make sure you discuss any questions you have with your health care provider. Document Revised: 06/10/2023 Document Reviewed: 01/08/2023 Elsevier Patient Education  2024 Elsevier Inc.   Common Medications Safe in Pregnancy  Acne:      Constipation:  Benzoyl Peroxide     Colace  Clindamycin      Dulcolax Suppository  Topica Erythromycin     Fibercon  Salicylic Acid      Metamucil         Miralax AVOID:        Senakot   Accutane    Cough:  Retin-A       Cough Drops  Tetracycline      Phenergan w/ Codeine if Rx  Minocycline      Robitussin (Plain & DM)  Antibiotics:     Crabs/Lice:  Ceclor       RID  Cephalosporins    AVOID:  E-Mycins      Kwell  Keflex  Macrobid/Macrodantin   Diarrhea:  Penicillin      Kao-Pectate  Zithromax      Imodium AD         PUSH FLUIDS AVOID:       Cipro     Fever:  Tetracycline      Tylenol (Regular or Extra  Minocycline       Strength)  Levaquin      Extra Strength-Do not          Exceed 8 tabs/24 hrs Caffeine:        200mg /day (equiv. To 1 cup of coffee or  approx. 3 12 oz sodas)         Gas: Cold/Hayfever:       Gas-X  Benadryl      Mylicon  Claritin       Phazyme  **Claritin-D        Chlor-Trimeton    Headaches:  Dimetapp      ASA-Free Excedrin  Drixoral-Non-Drowsy     Cold Compress  Mucinex (Guaifenasin)     Tylenol (Regular or Extra  Sudafed/Sudafed-12 Hour     Strength)  **Sudafed PE Pseudoephedrine   Tylenol Cold & Sinus     Vicks Vapor Rub  Zyrtec  **AVOID if Problems With Blood Pressure         Heartburn: Avoid lying down for at least 1 hour after meals  Aciphex      Maalox     Rash:  Milk of Magnesia     Benadryl    Mylanta       1% Hydrocortisone Cream  Pepcid  Pepcid Complete   Sleep  Aids:  Prevacid      Ambien   Prilosec       Benadryl  Rolaids       Chamomile Tea  Tums (Limit 4/day)     Unisom  Tylenol PM         Warm milk-add vanilla or  Hemorrhoids:       Sugar for taste  Anusol/Anusol H.C.  (RX: Analapram 2.5%)  Sugar Substitutes:  Hydrocortisone OTC     Ok in moderation  Preparation H      Tucks        Vaseline lotion applied to tissue with wiping    Herpes:     Throat:  Acyclovir      Oragel  Famvir  Valtrex     Vaccines:         Flu Shot Leg Cramps:       *Gardasil  Benadryl      Hepatitis A         Hepatitis B Nasal Spray:       Pneumovax  Saline Nasal Spray     Polio Booster         Tetanus Nausea:       Tuberculosis test or PPD  Vitamin B6 25 mg TID   AVOID:    Dramamine      *Gardasil  Emetrol       Live Poliovirus  Ginger Root 250 mg QID    MMR (measles, mumps &  High Complex Carbs @ Bedtime    rebella)  Sea Bands-Accupressure    Varicella (Chickenpox)  Unisom 1/2 tab TID     *No known complications           If received before Pain:         Known pregnancy;   Darvocet       Resume series after  Lortab        Delivery  Percocet    Yeast:   Tramadol      Femstat  Tylenol 3      Gyne-lotrimin  Ultram       Monistat  Vicodin           MISC:         All Sunscreens           Hair Coloring/highlights          Insect Repellant's          (Including DEET)         Mystic Tans   Commonly Asked Questions During Pregnancy   Cats: A parasite can be excreted in cat feces.  To avoid exposure you need to have another person empty the little box.  If you must empty the litter box you will need to wear gloves.  Wash your hands after handling your cat.  This parasite can also be found in raw or undercooked meat so this should also be avoided.  Colds, Sore Throats, Flu: Please check your medication sheet to see what you can take for symptoms.  If your symptoms are unrelieved by these medications please call the office.  Dental Work: Most  any dental work Agricultural consultant recommends is permitted.  X-rays should only be taken during the first trimester if absolutely necessary.  Your abdomen should be shielded with a lead apron during all x-rays.  Please notify your provider prior to receiving any x-rays.  Novocaine is fine; gas is not recommended.  If your dentist requires a note from [place] prior to dental work please call the office and we will provide one for you.  Exercise: Exercise is an important part of staying healthy during your pregnancy.  You may continue most exercises you were accustomed to prior to pregnancy.  Later in your pregnancy you will most likely notice you have difficulty with activities requiring balance like riding a bicycle.  It is important that you listen to your body and avoid activities that put you at a higher risk of falling.  Adequate rest and staying well hydrated are a must!  If you have questions about the safety of specific activities ask your provider.    Exposure to Children with illness: Try to avoid obvious exposure; report any symptoms to [place] when noted,  If you have chicken pos, red measles or mumps, you should be immune to these diseases.   Please do not take any vaccines while pregnant unless you have checked with your OB provider.  Fetal Movement: After 28 weeks we recommend you do "kick counts" twice daily.  Lie or sit down in a calm quiet environment and count your baby movements "kicks".  You should feel your baby at least 10 times per hour.  If you have not felt 10 kicks within the first hour get up, walk around and have something sweet to eat or drink then repeat for an additional hour.  If count remains less than 10 per hour notify your provider.  Fumigating: Follow your pest control agent's advice as to how long to stay out of your home.  Ventilate the area well before re-entering.  Hemorrhoids:   Most over-the-counter preparations can be used during pregnancy.  Check your medication to see what is  safe to use.  It is important to use a stool softener or fiber in your diet and to drink lots of liquids.  If hemorrhoids seem to be getting worse please call the office.   Hot Tubs:  Hot tubs Jacuzzis and saunas are not recommended while pregnant.  These increase your internal body temperature and should be avoided.  Intercourse:  Sexual intercourse is safe during pregnancy as long as you are comfortable, unless otherwise advised by your provider.  Spotting may occur after intercourse; report any bright red bleeding that is heavier than spotting.  Labor:  If you know that you are in labor, please go to the hospital.  If you are unsure, please call the office and let us help you decide what to do.  Lifting, straining, etc:  If your job requires heavy lifting or straining please check with your provider for any limitations.  Generally, you should not lift items heavier than that you can lift simply with your hands and arms (no back muscles)  Painting:  Paint fumes do not harm your pregnancy, but may make you ill and should be avoided if possible.  Latex or water based paints have less odor than oils.  Use adequate ventilation while painting.  Permanents & Hair Color:  Chemicals in hair dyes are not recommended as they cause increase hair dryness which can increase hair loss during pregnancy.  " Highlighting" and permanents are allowed.  Dye may be absorbed differently and permanents may not hold as well during pregnancy.  Sunbathing:  Use a sunscreen, as skin burns easily during pregnancy.  Drink plenty of fluids; avoid over heating.  Tanning Beds:  Because their possible side effects are still unknown, tanning beds are not recommended.  Ultrasound Scans:  Routine ultrasounds are performed at approximately 20 weeks.  You will be able to see your baby's general anatomy an if you would like to know the gender this can usually be determined as well.  If it is questionable when you conceived you may  also  receive an ultrasound early in your pregnancy for dating purposes.  Otherwise ultrasound exams are not routinely performed unless there is a medical necessity.  Although you can request a scan we ask that you pay for it when conducted because insurance does not cover " patient request" scans.  Work: If your pregnancy proceeds without complications you may work until your due date, unless your physician or employer advises otherwise.  Round Ligament Pain/Pelvic Discomfort:  Sharp, shooting pains not associated with bleeding are fairly common, usually occurring in the second trimester of pregnancy.  They tend to be worse when standing up or when you remain standing for long periods of time.  These are the result of pressure of certain pelvic ligaments called "round ligaments".  Rest, Tylenol and heat seem to be the most effective relief.  As the womb and fetus grow, they rise out of the pelvis and the discomfort improves.  Please notify the office if your pain seems different than that described.  It may represent a more serious condition.

## 2024-04-26 ENCOUNTER — Other Ambulatory Visit: Payer: Self-pay

## 2024-04-26 DIAGNOSIS — Z3687 Encounter for antenatal screening for uncertain dates: Secondary | ICD-10-CM | POA: Diagnosis not present

## 2024-04-26 DIAGNOSIS — Z3A09 9 weeks gestation of pregnancy: Secondary | ICD-10-CM | POA: Diagnosis not present

## 2024-05-11 ENCOUNTER — Ambulatory Visit (INDEPENDENT_AMBULATORY_CARE_PROVIDER_SITE_OTHER): Payer: Self-pay | Admitting: Advanced Practice Midwife

## 2024-05-11 ENCOUNTER — Encounter: Payer: Self-pay | Admitting: Advanced Practice Midwife

## 2024-05-11 ENCOUNTER — Other Ambulatory Visit (HOSPITAL_COMMUNITY)
Admission: RE | Admit: 2024-05-11 | Discharge: 2024-05-11 | Disposition: A | Discharge status: Home / Self Care | Source: Ambulatory Visit | Attending: Advanced Practice Midwife | Admitting: Advanced Practice Midwife

## 2024-05-11 VITALS — BP 108/71 | HR 98 | Wt 130.6 lb

## 2024-05-11 DIAGNOSIS — Z3481 Encounter for supervision of other normal pregnancy, first trimester: Secondary | ICD-10-CM | POA: Diagnosis not present

## 2024-05-11 DIAGNOSIS — Z1379 Encounter for other screening for genetic and chromosomal anomalies: Secondary | ICD-10-CM

## 2024-05-11 DIAGNOSIS — Z3480 Encounter for supervision of other normal pregnancy, unspecified trimester: Secondary | ICD-10-CM

## 2024-05-11 DIAGNOSIS — Z3A11 11 weeks gestation of pregnancy: Secondary | ICD-10-CM

## 2024-05-11 DIAGNOSIS — Z124 Encounter for screening for malignant neoplasm of cervix: Secondary | ICD-10-CM

## 2024-05-11 DIAGNOSIS — Z113 Encounter for screening for infections with a predominantly sexual mode of transmission: Secondary | ICD-10-CM | POA: Insufficient documentation

## 2024-05-11 DIAGNOSIS — Z130 Encounter for screening for diseases of the blood and blood-forming organs and certain disorders involving the immune mechanism: Secondary | ICD-10-CM

## 2024-05-11 DIAGNOSIS — A599 Trichomoniasis, unspecified: Secondary | ICD-10-CM | POA: Insufficient documentation

## 2024-05-11 DIAGNOSIS — Z131 Encounter for screening for diabetes mellitus: Secondary | ICD-10-CM

## 2024-05-11 DIAGNOSIS — Z0283 Encounter for blood-alcohol and blood-drug test: Secondary | ICD-10-CM

## 2024-05-11 DIAGNOSIS — R87612 Low grade squamous intraepithelial lesion on cytologic smear of cervix (LGSIL): Secondary | ICD-10-CM | POA: Insufficient documentation

## 2024-05-11 NOTE — Patient Instructions (Addendum)
 Pap Test: What to Know Why am I having this test? A Pap test, also called a Pap smear, is a screening test to check for signs of: Infection. Cancer of the cervix. The cervix is the lowest part of the uterus. Precancerous changes. These are changes that may be a sign that cancer is developing. Females need this test regularly. In general, you should have a Pap test every 3 years until you reach menopause or you are 33 years old. If you are 18-64 years old you may choose to have their Pap test done at the same time as an human papillomavirus (HPV) test every 5 years instead of every 3 years. Your health care provider may recommend having Pap tests more or less often depending on your medical conditions and past Pap test results. What is being tested? Cervical cells are tested for signs of infection or abnormalities. What kind of sample is taken?  Your provider will collect a sample of cells from the surface of your cervix. This will be done using a small cotton swab, plastic spatula, or brush that is inserted into your vagina using a tool called a speculum. This sample is often collected during a pelvic exam, when you are lying on your back on an exam table with your feet in footrests, called stirrups. In some cases, fluids (secretions) from the cervix or vagina may also be collected. How do I prepare for this test? Know where you are in your menstrual cycle. If you're menstruating on the day of the test, you may be asked to reschedule. You may need to reschedule if you have a known vaginal infection on the day of the test. Follow instructions from your provider about: Changing or stopping your regular medicines. Some medicines, such as vaginal medicines and tetracycline, can cause abnormal test results. Avoiding douching 2-3 days before or the day of the test. Tell a health care provider about: Any allergies you have. All medicines you take. These include vitamins, herbs, eye drops, and  creams. Any bleeding problems you have. Any surgeries you've had. Any medical problems you have. Whether you're pregnant or may be pregnant. How are the results reported? Your test results will be reported as either abnormal or normal. What do the results mean? A normal test result means that you do not have signs of cancer of the cervix. An abnormal result may mean that you have: Cancer. A Pap test by itself is not enough to diagnose cancer. You will have more tests done if cancer is suspected. Precancerous changes in your cervix. Inflammation of the cervix. A sexually transmitted infection (STI). A fungal infection. An infection from a parasite. Talk with your provider about what your results mean. More tests may be needed. Questions to ask your health care provider Ask your provider, or the department that is doing the test: When will my results be ready? How will I get my results? What are my treatment options? What other tests do I need? What are my next steps? This information is not intended to replace advice given to you by your health care provider. Make sure you discuss any questions you have with your health care provider. Document Revised: 11/27/2023 Document Reviewed: 11/27/2023 Elsevier Patient Education  2025 Else[employer] Inc.Iron-Rich Diet  Iron is a mineral that helps your body produce hemoglobin. Hemoglobin is a protein in red blood cells that carries oxygen to your body's tissues. Eating too little iron may cause you to feel weak and tired, and it can  increase your risk of infection. Iron is naturally found in many foods, and many foods have iron added to them (are iron-fortified). You may need to follow an iron-rich diet if you do not have enough iron in your body due to certain medical conditions. The amount of iron that you need each day depends on your age, your sex, and any medical conditions you have. Follow instructions from your health care provider or a dietitian  about how much iron you should eat each day. What are tips for following this plan? Reading food labels Check food labels to see how many milligrams (mg) of iron are in each serving. Cooking Cook foods in pots and pans that are made from iron. Take these steps to make it easier for your body to absorb iron from certain foods: Soak beans overnight before cooking. Soak whole grains overnight and drain them before using. Ferment flours before baking, such as by using yeast in bread dough. Meal planning When you eat foods that contain iron, you should eat them with foods that are high in vitamin C. These include oranges, peppers, tomatoes, potatoes, and mangoes. Vitamin C helps your body absorb iron. Certain foods and drinks prevent your body from absorbing iron properly. Avoid eating these foods in the same meal as iron-rich foods or with iron supplements. These foods include: Coffee, black tea, and red wine. Milk, dairy products, and foods that are high in calcium. Beans and soybeans. Whole grains. General information Take iron supplements only as told by your health care provider. An overdose of iron can be life-threatening. If you were prescribed iron supplements, take them with orange juice or a vitamin C supplement. When you eat iron-fortified foods or take an iron supplement, you should also eat foods that naturally contain iron, such as meat, poultry, and fish. Eating naturally iron-rich foods helps your body absorb the iron that is added to other foods or contained in a supplement. Iron from animal sources is better absorbed than iron from plant sources. What foods should I eat? Vegetables Spinach (cooked). Green peas. Broccoli. Fermented vegetables. Eat vegetables high in vitamin C, such as leafy greens, potatoes, bell peppers, and tomatoes, with iron-rich foods. Grains Iron-fortified breakfast cereal. Iron-fortified whole-wheat bread. Enriched rice. Sprouted grains. Meats and other  proteins Beef liver. Beef. [place]. Chicken. Oysters. Shrimp. Tuna. Sardines. Chickpeas. Nuts. Tofu. Pumpkin seeds. Beverages Tomato juice. Fresh orange juice. Prune juice. Hibiscus tea. Iron-fortified instant breakfast shakes. Sweets and desserts Blackstrap molasses. Seasonings and condiments Tahini. Fermented soy sauce. Other foods Wheat germ. The items listed above may not be a complete list of recommended foods and beverages. Contact a dietitian for more information. What foods should I limit? These are foods that should be limited while eating iron-rich foods as they can reduce the absorption of iron in your body. Grains Whole grains. Bran cereal. Bran flour. Meats and other proteins Soybeans. Products made from soy protein. Black beans. Lentils. Mung beans. Split peas. Dairy Milk. Cream. Cheese. Yogurt. Cottage cheese. Beverages Coffee. Black tea. Red wine. Sweets and desserts Cocoa. Chocolate. Ice cream. Seasonings and condiments Basil. Oregano. Large amounts of parsley. The items listed above may not be a complete list of foods and beverages you should limit. Contact a dietitian for more information. Summary Iron is a mineral that helps your body produce hemoglobin. Hemoglobin is a protein in red blood cells that carries oxygen to your body's tissues. Iron is naturally found in many foods, and many foods have iron added to  them (are iron-fortified). When you eat foods that contain iron, you should eat them with foods that are high in vitamin C. Vitamin C helps your body absorb iron. Certain foods and drinks prevent your body from absorbing iron properly, such as whole grains and dairy products. You should avoid eating these foods in the same meal as iron-rich foods or with iron supplements. This information is not intended to replace advice given to you by your health care provider. Make sure you discuss any questions you have with your health care provider. Document Revised:  08/22/2023 Document Reviewed: 08/22/2023 Elsevier Patient Education  2025 Elsevier Inc.Managing Anxiety, Adult After being diagnosed with anxiety, you may be relieved to know why you have felt or behaved a certain way. You may also feel overwhelmed about the treatment ahead and what it will mean for your life. With care and support, you can manage your anxiety. How to manage lifestyle changes Understanding the difference between stress and anxiety Although stress can play a role in anxiety, it is not the same as anxiety. Stress is your body's reaction to life changes and events, both good and bad. Stress is often caused by something external, such as a deadline, test, or competition. It normally goes away after the event has ended and will last just a few hours. But, stress can be ongoing and can lead to more than just stress. Anxiety is caused by something internal, such as imagining a terrible outcome or worrying that something will go wrong that will greatly upset you. Anxiety often does not go away even after the event is over, and it can become a long-term (chronic) worry. Lowering stress and anxiety Talk with your health care provider or a counselor to learn more about lowering anxiety and stress. They may suggest tension-reduction techniques, such as: Music. Spend time creating or listening to music that you enjoy and that inspires you. Mindfulness-based meditation. Practice being aware of your normal breaths while not trying to control your breathing. It can be done while sitting or walking. Centering prayer. Focus on a word, phrase, or sacred image that means something to you and brings you peace. Deep breathing. Expand your stomach and inhale slowly through your nose. Hold your breath for 3-5 seconds. Then breathe out slowly, letting your stomach muscles relax. Self-talk. Learn to notice and spot thought patterns that lead to anxiety reactions. Change those patterns to thoughts that feel  peaceful. Muscle relaxation. Take time to tense muscles and then relax them. Choose a tension-reduction technique that fits your lifestyle and personality. These techniques take time and practice. Set aside 5-15 minutes a day to do them. Specialized therapists can offer counseling and training in these techniques. The training to help with anxiety may be covered by some insurance plans. Other things you can do to manage stress and anxiety include: Keeping a stress diary. This can help you learn what triggers your reaction and then learn ways to manage your response. Thinking about how you react to certain situations. You may not be able to control everything, but you can control your response. Making time for activities that help you relax and not feeling guilty about spending your time in this way. Doing visual imagery. This involves imagining or creating mental pictures to help you relax. Practicing yoga. Through yoga poses, you can lower tension and relax.  Medicines Medicines for anxiety include: Antidepressant medicines. These are usually prescribed for long-term daily control. Anti-anxiety medicines. These may be added in severe cases, especially  when panic attacks occur. When used together, medicines, psychotherapy, and tension-reduction techniques may be the most effective treatment. Relationships Relationships can play a big part in helping you recover. Spend more time connecting with trusted friends and family members. Think about going to couples counseling if you have a partner, taking family education classes, or going to family therapy. Therapy can help you and others better understand your anxiety. How to recognize changes in your anxiety Everyone responds differently to treatment for anxiety. Recovery from anxiety happens when symptoms lessen and stop interfering with your daily life at home or work. This may mean that you will start to: Have better concentration and focus. Worry  will interfere less in your daily thinking. Sleep better. Be less irritable. Have more energy. Have improved memory. Try to recognize when your condition is getting worse. Contact your provider if your symptoms interfere with home or work and you feel like your condition is not improving. Follow these instructions at home: Activity Exercise. Adults should: Exercise for at least 150 minutes each week. The exercise should increase your heart rate and make you sweat (moderate-intensity exercise). Do strengthening exercises at least twice a week. Get the right amount and quality of sleep. Most adults need 7-9 hours of sleep each night. Lifestyle  Eat a healthy diet that includes plenty of vegetables, fruits, whole grains, low-fat dairy products, and lean protein. Do not eat a lot of foods that are high in fats, added sugars, or salt (sodium). Make choices that simplify your life. Do not use any products that contain nicotine or tobacco. These products include cigarettes, chewing tobacco, and vaping devices, such as e-cigarettes. If you need help quitting, ask your provider. Avoid caffeine, alcohol, and certain over-the-counter cold medicines. These may make you feel worse. Ask your pharmacist which medicines to avoid. General instructions Take over-the-counter and prescription medicines only as told by your provider. Keep all follow-up visits. This is to make sure you are managing your anxiety well or if you need more support. Where to find support You can get help and support from: Self-help groups. Online and [employer]. A trusted spiritual leader. Couples counseling. Family education classes. Family therapy. Where to find more information You may find that joining a support group helps you deal with your anxiety. The following sources can help you find counselors or support groups near you: Mental Health America: mentalhealthamerica.net Anxiety and Depression Association  of [place] (ADAA): adaa.org [employer] on Mental Illness (NAMI): nami.org Contact a health care provider if: You have a hard time staying focused or finishing tasks. You spend many hours a day feeling worried about everyday life. You are very tired because you cannot stop worrying. You start to have headaches or often feel tense. You have chronic nausea or diarrhea. Get help right away if: Your heart feels like it is racing. You have shortness of breath. You have thoughts of hurting yourself or others. Get help right away if you feel like you may hurt yourself or others, or have thoughts about taking your own life. Go to your nearest emergency room or: Call 911. Call the National Suicide Prevention Lifeline at (907)120-2820 or 988. This is open 24 hours a day. Text the Crisis Text Line at (313)138-8625. This information is not intended to replace advice given to you by your health care provider. Make sure you discuss any questions you have with your health care provider. Document Revised: 06/16/2022 Document Reviewed: 12/29/2020 Elsevier Patient Education  2024 Elsevier Inc.Pregnancy: Healthy  Eating While you're pregnant, your body needs extra nutrition for your growing baby. You also need more vitamins and minerals, such as folic acid, calcium, iron, and vitamin D. Eating a balanced diet is important for both you and your baby. Your need for extra calories will change during pregnancy. During the first 3 months of pregnancy, called the first trimester, you don't need more calories. During the second trimester, you'll need about 340 extra calories a day. During the third trimester, you'll need about 450 extra calories a day. If you're carrying more than one baby, talk with your health care provider or a dietitian to learn more about your specific eating needs. What are tips for eating healthy during pregnancy? Meal planning  Eating smaller meals throughout the day may help manage some  side effects common in pregnancy, like heartburn and reflux. Eat a variety of foods. Be sure to include many types of fruits and vegetables. Two or more servings of fish are recommended each week. Choose fish that are lower in mercury, such as salmon and pollock. Limit foods that have empty calories. These are foods that have little nutritional value, such as sweets, desserts, candies, and drinks with sugar in them. Drinks that have caffeine are OK to drink, but it's better to avoid caffeine. Limit your total caffeine intake to less than 200 mg each day, or the limit you're told by your provider. Be aware that 200 mg of caffeine is 12 oz or 355 mL of coffee, tea, or soda. General information Take a prenatal vitamin to help meet your vitamin and mineral needs during pregnancy. This includes your need for folic acid, iron, calcium, and vitamin D. Do not try to lose weight or go on a diet during pregnancy. Food safety  Wash your hands before you eat and after you prepare raw meat. Wash all fruits and vegetables well before peeling or eating. Make sure that all meats, poultry, and eggs are cooked to food-safe temperatures or well-done. Taking these actions can help keep your food safe and protect you and your baby from dangerous food illnesses. Ask your provider for more information. What foods should I eat? Fruits All fruits. Eat a variety of colors and types of fruit. Remember to wash your fruits well before peeling or eating. Vegetables All vegetables. Eat a variety of colors and types of vegetables. Remember to wash your vegetables well before peeling or eating. Grains All grains. Choose whole grains, such as whole-wheat bread, oatmeal, or brown rice. Meats and other protein foods Lean meats, including chicken, [place], and lean cuts of beef, veal, or pork. Fish that is higher in omega-3 fatty acids and lower in mercury, such as salmon, herring, mussels, trout, sardines, pollock, shrimp,  crab, and lobster. Tofu. Tempeh. Beans. Eggs. Peanut butter and other nut butters. Dairy Pasteurized milk and milk alternatives, such as soy milk. Pasteurized yogurt and pasteurized cheese. Cottage cheese. Sour cream. Beverages Water. Juices that contain 100% fruit juice or vegetable juice. Caffeine-free teas and decaffeinated coffee. Fats and oils Fats and oils are OK to include in moderation. Sweets and desserts Sweets and desserts are OK to include in moderation. Seasoning and other foods All pasteurized condiments. The items listed above may not be all the foods and drinks you can have. Talk with a dietitian to learn more. What does 340 extra calories look like? Healthy snacks that give you 340 more calories a day could be: Peanut butter and jelly with milk: 8 oz (237 mL) of low-fat  milk. Peanut butter and jelly sandwich made with: 1 slice of whole-wheat bread. 2 teaspoons (10 g) of peanut butter. Yogurt and berries: 1 cup (245 g) of [place] yogurt. 1 cup (150 g) of berries. 2 tablespoons (30 g) of chopped nuts, such as almonds or walnuts. Avocado toast: 1 slice of whole-wheat bread. 1/2 medium avocado (70 g). 1 large egg (50 g). What foods should I avoid? Fruits Raw (unpasteurized) fruit juices. Vegetables Unpasteurized vegetable juices. Meats and other protein foods Precooked or cured meat, such as bologna, hot dogs, sausages, or meat loaves. (If you must eat those meats, reheat them until they are steaming hot.) Refrigerated pate, meat spreads from a meat counter, or smoked seafood that's found in the refrigerated section of a store. Raw or undercooked meats, poultry, and eggs. Raw fish, such as sushi or sashimi. Fish that have high mercury content, such as tilefish, shark, swordfish, and king mackerel. Dairy Unpasteurized or raw milk and any foods that are made from them. Some of these may be: Homemade yogurts or puddings. Soft cheeses such as: Feta. Queso blanco or  f[place]. Pharmacist, hospital or [place]. Blue-veined cheeses. Some of these types of cheeses may be made with pasteurized milk. Check the label. If pasteurized milk is used, they are OK to eat during pregnancy. Deli foods Premade foods from a store or deli, like chicken salad, coleslaw, or egg salad. These are riskier for food illness than fresh or homemade salads. Beverages Alcohol. Sugar-sweetened drinks, such as sodas or teas. Energy drinks. Seasoning and other foods Homemade fermented foods and drinks, such as: Pickles. Sauerkraut. Kombucha. Store-bought pasteurized versions of these are OK. The items listed above may not be all the foods and drinks you should avoid. Talk with a dietitian to learn more. Where to find more information To learn more, go to: Centers for Disease Control and Prevention at TonerPromos.no. Click Search and type food choices for pregnancy. Find the link you need. MyPlate at http://pittman-dennis.biz/. This information is not intended to replace advice given to you by your health care provider. Make sure you discuss any questions you have with your health care provider. Document Revised: 08/25/2023 Document Reviewed: 08/25/2023 Elsevier Patient Education  2025 Elsevier Inc.Exercise During Pregnancy Exercise is an important part of being healthy for people of all ages. Exercise helps your heart and lungs work well. Exercise also: Helps you stay strong and flexible. Helps you keep a healthy body weight. Boosts your energy levels and improves your mood. You should try to exercise regularly during pregnancy. Exercise routines may need to change later in your pregnancy. In rare cases, certain medical problems in your pregnancy may limit the exercise you can do during pregnancy. Your health care provider will give you information on what exercises will work for you. How does exercise help during pregnancy? Along with staying strong and flexible, exercising during pregnancy can  help: Keep strength in muscles that are used during labor and birth. Control weight gain. Speed up your recovery after giving birth. Reduce the need for insulin if you get diabetes during pregnancy. Decrease low back pain. Lower the risk for depression. Lower the risk of cesarean delivery. Treat trouble pooping (constipation). How does exercise affect my baby? Exercise can help you have a healthy pregnancy. Exercise does not cause your baby to be born early. It will not cause your baby to weigh less at birth. What exercises can I do? Many exercises are safe for you to do during pregnancy. Do a variety of exercises  that safely increase your heart and breathing rates and help you build and maintain muscle strength. Do exercises as told by your provider. Your provider may recommend: Walking. Swimming. Water aerobics. Riding a stationary bike. Modified yoga or Pilates. Tell your instructor that you're pregnant. Avoid overstretching. Avoid lying on your back for long periods of time. Resistance exercises with weights or elastic bands. Running or jogging. Choose this type of exercise only if: You ran or jogged regularly before your pregnancy. You can run or jog and still talk in full sentences. What exercises should I avoid? You may be told to limit high-intensity exercise depending on your level of fitness and if you exercised regularly before you became pregnant. You can tell that you're exercising at a high intensity if you're breathing much harder and faster and can't hold a conversation while exercising. You may be told to: Avoid jogging or running, unless you jogged or ran regularly before you became pregnant. Do not run or jog so fast that you're unable to have a conversation. Avoid activities that put you at risk for falling on your belly or getting hit in the belly. Some of these are: Downhill skiing. Rock climbing. Cycling and gymnastics. Horseback riding. Surfing and  waterskiing. Contact sports. Avoid scuba diving. Avoid skydiving. Avoid activities that take place in a room that's heated to high temperatures, such as hot yoga or hot Pilates. How do I exercise in a safe way?  Start slowly. Ask your provider to recommend the types of exercise that are safe for you. Avoid overheating. Do not exercise in very high temperatures or hot rooms. Avoid hot yoga or hot Pilates. Avoid standing still or lying flat on your back as much as you can. Avoid losing too much fluid (dehydration). Drink more fluids as told. Drink before, during, and after you exercise. Avoid overstretching. Because of hormone changes during pregnancy, it's easy to overstretch muscles, tendons, and ligaments. Ligaments are the tissues that connect bones to each other. Do not exercise to lose weight. Do not exercise at more than 6,000 feet above sea level (high elevation) if you don't live at that elevation. Tips and recommendations Wear loose-fitting, breathable clothes. Wear a sports bra to support your breasts. Exercise on most days or all days of the week. Try to exercise for 30 minutes a day, 5 days a week. If problems come up during your pregnancy, you provider may tell you to limit some exercises or to exercise less. If you have concerns, ask your provider. If you actively exercised before your pregnancy, your provider may tell you to continue to do moderate-intensity to high-intensity exercise. If you're just starting to exercise or didn't exercise much before your pregnancy, your provider may tell you to do low-intensity to moderate-intensity exercise. Questions to ask your health care provider Is exercise safe for me? What are signs that I should stop exercising? Does my health condition mean that I should not exercise during pregnancy? When should I avoid exercising during pregnancy? Stop exercising and contact a health care provider if: You have any unusual symptoms, such  as: Mild contractions or cramps in the belly. Dizziness that does not go away when you rest. Headache. Pain and swelling of your calves. Bleeding or fluid leaking from your vagina. Stop exercising and get help right away if: You have: Chest pain. Shortness of breath. Sudden, severe pain in your low back or your belly. Regular, painful contractions before 37 weeks of pregnancy. These symptoms may be an emergency.  Call 911 right away. Do not wait to see if the symptoms will go away. Do not drive yourself to the hospital. This information is not intended to replace advice given to you by your health care provider. Make sure you discuss any questions you have with your health care provider. Document Revised: 05/03/2023 Document Reviewed: 05/03/2023 Elsevier Patient Education  2024 Elsevier Inc.Second Trimester of Pregnancy  The second trimester of pregnancy is from week 14 through week 27. This is months 4 through 6 of pregnancy. During the second trimester: Morning sickness is less or has stopped. You may have more energy. You may feel hungry more often. At this time, your unborn baby is growing very fast. At the end of the sixth month, the unborn baby may be up to 12 inches long and weigh about 1 pounds. You will likely start to feel the baby move between 16 and 20 weeks of pregnancy. Body changes during your second trimester Your body continues to change during this time. The changes usually go away after your baby is born. Physical changes You will gain more weight. Your belly will get bigger. You may begin to get stretch marks on your hips, belly, and breasts. Your breasts will keep growing and may hurt. You may get dark spots or blotches on your face. A dark line from your belly button to the pubic area may appear. This line is called linea nigra. Your hair may grow faster and get thicker. Health changes You may have headaches. You may have heartburn. You may pee more  often. You may have swollen, bulging veins (varicose veins). You may have trouble pooping (constipation), or swollen veins in the butt that can itch or get painful (hemorrhoids). You may have back pain. This is caused by: Weight gain. Pregnancy hormones that are relaxing the joints in your pelvis. Follow these instructions at home: Medicines Talk to your health care provider if you're taking medicines. Ask if the medicines are safe to take during pregnancy. Your provider may change the medicines that you take. Do not take any medicines unless told to by your provider. Take a prenatal vitamin that has at least 600 micrograms (mcg) of folic acid. Do not use herbal medicines, illegal drugs, or medicines that are not approved by your provider. Eating and drinking While you're pregnant your body needs extra food for your growing baby. Talk with your provider about what to eat while pregnant. Activity Most women are able to exercise during pregnancy. Exercises may need to change as your pregnancy goes on. Talk to your provider about your activities and exercise routines. Relieving pain and discomfort Wear a good, supportive bra if your breasts hurt. Rest with your legs raised if you have leg cramps or low back pain. Take warm sitz baths to soothe pain from hemorrhoids. Use hemorrhoid cream if your provider says it's okay. Do not douche. Do not use tampons or scented pads. Do not use hot tubs, steam rooms, or saunas. Safety Wear your seatbelt at all times when you're in a car. Talk to your provider if someone hits you, hurts you, or yells at you. Talk with your provider if you're feeling sad or have thoughts of hurting yourself. Lifestyle Certain things can be harmful while you're pregnant. It's best to avoid the following: Do not drink alcohol,smoke, vape, or use products with nicotine or tobacco in them. If you need help quitting, talk with your provider. Avoid cat litter boxes and soil  used by cats. These things  carry germs that can cause harm to your pregnancy and your baby. General instructions Keep all follow-up visits. It helps you and your unborn baby stay as healthy as possible. Write down your questions. Take them to your prenatal visits. Your provider will: Talk with you about your overall health. Give you advice or refer you to specialists who can help with different needs, including: Prenatal education classes. Mental health and counseling. Foods and healthy eating. Ask for help if you need help with food. Where to find more information American Pregnancy Association: americanpregnancy.org [employer]ians [employer] Gynecologists: acog.org Office on [employer] Health: [employer] Contact a health care provider if: You have a headache that does not go away when you take medicine. You have any of these problems: You can't eat or drink. You throw up or feel like you may throw up. You have watery poop (diarrhea) for 2 days or more. You have pain when you pee or your pee smells bad. You have been sick for 2 days or more and are not getting better. Contact your provider right away if: You have any of these coming from your vagina: Abnormal discharge. Bad-smelling fluid. Bleeding. Your baby is moving less than usual. You have contractions, belly cramping, or have pain in your pelvis or lower back. You have symptoms of high blood pressure or preeclampsia. These include: A severe, throbbing headache that does not go away. Sudden or extreme swelling of your face, hands, legs, or feet. Vision problems: You see spots. You have blurry vision. Your eyes are sensitive to light. If you can't reach the provider, go to an urgent care or emergency room. Get help right away if: You faint, become confused, or can't think clearly. You have chest pain or trouble breathing. You have any kind of injury, such as from a fall or a car crash. These symptoms may be  an emergency. Call 911 right away. Do not wait to see if the symptoms will go away. Do not drive yourself to the hospital. This information is not intended to replace advice given to you by your health care provider. Make sure you discuss any questions you have with your health care provider. Document Revised: 06/10/2023 Document Reviewed: 01/08/2023 Elsevier Patient Education  2024 [employer].

## 2024-05-11 NOTE — Progress Notes (Signed)
 New Obstetric Patient H&P    Chief Complaint: Desires prenatal care   History of Present Illness: Patient is a 33 y.o. [name] Not Hispanic or Latino female, presents with amenorrhea and positive home pregnancy test. Patient's last menstrual period was 02/20/2024 (exact date). and based on her  LMP, her EDD is Estimated Date of Delivery: 11/26/24 and her EGA is [redacted]w[redacted]d. Cycles are 4-5 days, regular, and occur approximately every : 23 days. Her last pap smear was 5 years ago and was no abnormalities.    She had a urine pregnancy test which was positive 6 week(s)  ago. Her last menstrual period was normal and lasted for  4 or 5 day(s). Since her LMP she claims she has experienced breast tenderness, fatigue, nausea, vomiting. She denies vaginal bleeding. Her past medical history is noncontributory. Her prior pregnancies are notable for uncomplicated vaginal deliveries. G1 small for gestational age.  Since her LMP, she admits to the use of tobacco products  no She claims she has gained  10 pounds since the start of her pregnancy.  There are cats in the home in the home  no  She admits close contact with children on a regular basis  yes  She has had chicken pox in the past yes She has had Tuberculosis exposures, symptoms, or previously tested positive for TB   no Current or past history of domestic violence. no  Genetic Screening/Teratology Counseling: (Includes patient, baby's father, or anyone in either family with:)   Flowsheet Row Video Visit from [facility]/24/2024 in [facility]  Father of baby (name): Alm  Thalassemia No  Neural Tube Defect No  Down's Syndrome No  Jimmy [place] No  Sickle Cell Yes (comment if maternal / paternal)  [FOB of Baby]  Hemophilia No  Muscular Dystrophy No  Cystic Fibrosis No  Huntington's Disease No  Mental Retardation No  Fragile X No  Chromosomal Disease No  History of Stillbirth No     1. Maternal metabolic disorder (DM, PKU,  etc)  no 2. Patient or FOB with a child with a birth defect not listed above no  3. Patient or FOB with a birth defect themselves no 4. Recurrent pregnancy loss, or stillbirth  no  5. Any medications since LMP other than prenatal vitamins (include vitamins, supplements, OTC meds, drugs, alcohol)  using MJ, has been cutting down 6. Any other genetic/environmental exposure to discuss  no  Infection History:   1. Lives with someone with TB or TB exposed  no  2. Patient or partner has history of genital herpes  no 3. Rash or viral illness since LMP  no 4. History of STI (GC, CT, HPV, syphilis, HIV)  no 5. History of recent travel :  no  Other pertinent information:  no     Review of Systems:10 point review of systems negative unless otherwise noted in HPI  Past Medical History:  Patient Active Problem List   Diagnosis Date Noted   Supervision of other normal pregnancy, antepartum 04/24/2024     Clinical Staff Provider  Office Location   Ob/Gyn Dating  11/26/2024, by LMP c/w 9w u/s  Language  English Anatomy US     Flu Vaccine  Offer Genetic Screen  NIPS:   TDaP vaccine   Offer Hgb A1C or  GTT Early : Third trimester :   Covid    LAB RESULTS   Rhogam     Blood Type     RSV Offer Antibody  Feeding Plan Breastfeed Rubella    Contraception Undecided RPR     Circumcision Yes HBsAg     Pediatrician  [facility]    Support Person FOB & Pt's Mom Varicella    Prenatal Classes Yes GBS  (For PCN allergy, check sensitivities)     Hep C     BTL Consent  Pap No results found for: DIAGPAP  VBAC Consent NA Hgb Electro      CF      SMA              Past Surgical History:  Past Surgical History:  Procedure Laterality Date   elective abortion x2     WISDOM TOOTH EXTRACTION      Gynecologic History: Patient's last menstrual period was 02/20/2024 (exact date).  Obstetric History: [name]  Family History:  Family History  Problem Relation Age of Onset    Hypertension Mother    Alcohol abuse Father    Diabetes Maternal Grandmother    Breast cancer Maternal Grandmother        2s   Diabetes Maternal Grandfather    Lung cancer Maternal Grandfather     Social History:  Social History   Socioeconomic History   Marital status: Significant Other    Spouse name: Not on file   Number of children: 2   Years of education: Not on file   Highest education level: Associate degree: occupational, Scientist, product/process development, or vocational program  Occupational History   Not on file  Tobacco Use   Smoking status: Former    Current packs/day: 0.00    Types: Cigarettes    Start date: 04/08/2010    Quit date: 04/09/2019    Years since quitting: 5.0   Smokeless tobacco: Never   Tobacco comments:    Client smokes Black and Milds  Vaping Use   Vaping status: Former  Substance and Sexual Activity   Alcohol use: Not Currently    Alcohol/week: 1.0 standard drink of alcohol    Types: 1 Shots of liquor per week    Comment: last ETOH 03/17/24   Drug use: Yes    Types: Marijuana   Sexual activity: Not Currently    Partners: Male    Birth control/protection: None  Other Topics Concern   Not on file  Social History Narrative   Not on file   Social Drivers of Health   Financial Resource Strain: Low Risk  (04/21/2024)   Overall Financial Resource Strain (CARDIA)    Difficulty of Paying Living Expenses: Not very hard  Food Insecurity: Food Insecurity Present (04/21/2024)   Hunger Vital Sign    Worried About Running Out of Food in the Last Year: Often true    Ran Out of Food in the Last Year: Often true  Transportation Needs: No Transportation Needs (04/21/2024)   PRAPARE - Administrator, Civil Service (Medical): No    Lack of Transportation (Non-Medical): No  Physical Activity: Inactive (04/21/2024)   Exercise Vital Sign    Days of Exercise per Week: 1 day    Minutes of Exercise per Session: 0 min  Stress: Stress Concern Present (04/21/2024)   [employer] of Occupational Health - Occupational Stress Questionnaire    Feeling of Stress: Rather much  Social Connections: Moderately Integrated (04/21/2024)   Social Connection and Isolation Panel    Frequency of Communication with Friends and Family: More than three times a week    Frequency of Social Gatherings with Friends and Family:  Once a week    Attends Religious Services: More than 4 times per year    Active Member of Clubs or Organizations: No    Attends Banker Meetings: Not on file    Marital Status: Living with partner  Intimate Partner Violence: Not At Risk (04/24/2024)   Humiliation, Afraid, Rape, and Kick questionnaire    Fear of Current or Ex-Partner: No    Emotionally Abused: No    Physically Abused: No    Sexually Abused: No    Allergies:  No Known Allergies  Medications: Prior to Admission medications   Medication Sig Start Date End Date Taking? Authorizing Provider  Prenatal Vit-Fe Fumarate-FA (PRENATAL VITAMIN) 27-0.8 MG TABS Take 1 tablet by mouth daily at 6 (six) AM. 04/12/19  Yes Eldonna Suzen Octave, MD    Physical Exam Vitals: BP 108/71   Pulse 98   Wt 130 lb 9.6 oz (59.2 kg)   LMP 02/20/2024 (Exact Date)   BMI 22.42 kg/m   General: NAD HEENT: normocephalic, anicteric Thyroid: no enlargement, no palpable nodules Pulmonary: No increased work of breathing, CTAB Cardiovascular: RRR, distal pulses 2+ Abdomen: NABS, soft, non-tender, non-distended.  Umbilicus without lesions.  No hepatomegaly, splenomegaly or masses palpable. No evidence of hernia  Genitourinary:  External: Normal external female genitalia.  Normal urethral meatus, normal Bartholin's and Skene's glands.    Vagina: Normal vaginal mucosa, no evidence of prolapse.    Cervix: Grossly normal in appearance, no bleeding  Uterus:  Non-enlarged, mobile, normal contour.  No CMT  Adnexa: ovaries non-enlarged, no adnexal masses  Rectal: deferred Extremities: no edema, erythema, or  tenderness Neurologic: Grossly intact Psychiatric: mood appropriate, affect full   The following were addressed during this visit:  Breastfeeding Education - Early initiation of breastfeeding    Comments: Keeps milk supply adequate, helps contract uterus and slow bleeding, and early milk is the perfect first food and is easy to digest.   - The importance of exclusive breastfeeding    Comments: Provides antibodies, Lower risk of breast and ovarian cancers, and type-2 diabetes,Helps your body recover, Reduced chance of SIDS.   - Risks of giving your baby anything other than breast milk if you are breastfeeding    Comments: Make the baby less content with breastfeeds, may make my baby more susceptible to illness, and may reduce my milk supply.   - The importance of early skin-to-skin contact    Comments:  Keeps baby warm and secure, helps keep baby's blood sugar up and breathing steady, easier to bond and breastfeed, and helps calm baby.  - Rooming-in on a 24-hour basis    Comments: Easier to learn baby's feeding cues, easier to bond and get to know each other, and encourages milk production.   - Feeding on demand or baby-led feeding    Comments: Helps prevent breastfeeding complications, helps bring in good milk supply, prevents under or overfeeding, and helps baby feel content and satisfied   - Frequent feeding to help assure optimal milk production    Comments: Making a full supply of milk requires frequent removal of milk from breasts, infant will eat 8-12 times in 24 hours, if separated from infant use breast massage, hand expression and/ or pumping to remove milk from breasts.   - Effective positioning and attachment    Comments: Helps my baby to get enough breast milk, helps to produce an adequate milk supply, and helps prevent nipple pain and damage   - Exclusive breastfeeding for the first  6 months    Comments: Builds a healthy milk supply and keeps it up, protects baby  from sickness and disease, and breastmilk has everything your baby needs for the first 6 months.    Assessment: 33 y.o. [name] at [redacted]w[redacted]d presenting to initiate prenatal care  Plan: 1) Avoid alcoholic beverages. 2) Patient encouraged not to smoke.  3) Discontinue the use of all non-medicinal drugs and chemicals.  4) Take prenatal vitamins daily.  5) Nutrition, food safety (fish, cheese advisories, and high nitrite foods) and exercise discussed. 6) Hospital and practice style discussed with cross coverage system.  7) Genetic Screening, such as with 1st Trimester Screening, cell free fetal DNA, AFP testing, and Ultrasound, as well as with amniocentesis and CVS as appropriate, is discussed with patient. At the conclusion of today's visit patient requested genetic testing 8) Patient is asked about travel to areas at risk for the Zika virus, and counseled to avoid travel and exposure to mosquitoes or sexual partners who may have themselves been exposed to the virus. Testing is discussed, and will be ordered as appropriate.  9) [facility] labs today 10) Return to clinic in 4 weeks for ROB   Slater Rains, CNM [facility] 05/11/2024 12:15 PM

## 2024-05-12 LAB — CBC/D/PLT+RPR+RH+ABO+RUBIGG...
Antibody Screen: NEGATIVE
Basophils Absolute: 0 x10E3/uL (ref 0.0–0.2)
Basos: 0 %
EOS (ABSOLUTE): 0.1 x10E3/uL (ref 0.0–0.4)
Eos: 1 %
HCV Ab: NONREACTIVE
HIV Screen 4th Generation wRfx: NONREACTIVE
Hematocrit: 36.2 % (ref 34.0–46.6)
Hemoglobin: 12.1 g/dL (ref 11.1–15.9)
Hepatitis B Surface Ag: NEGATIVE
Immature Grans (Abs): 0.1 x10E3/uL (ref 0.0–0.1)
Immature Granulocytes: 1 %
Lymphocytes Absolute: 2.7 x10E3/uL (ref 0.7–3.1)
Lymphs: 28 %
MCH: 31.2 pg (ref 26.6–33.0)
MCHC: 33.4 g/dL (ref 31.5–35.7)
MCV: 93 fL (ref 79–97)
Monocytes Absolute: 0.6 x10E3/uL (ref 0.1–0.9)
Monocytes: 6 %
Neutrophils Absolute: 6 x10E3/uL (ref 1.4–7.0)
Neutrophils: 64 %
Platelets: 299 x10E3/uL (ref 150–450)
RBC: 3.88 x10E6/uL (ref 3.77–5.28)
RDW: 12.3 % (ref 11.7–15.4)
RPR Ser Ql: NONREACTIVE
Rh Factor: POSITIVE
Rubella Antibodies, IGG: 1.82 {index} (ref >0.99)
Varicella zoster IgG: REACTIVE
WBC: 9.4 x10E3/uL (ref 3.4–10.8)

## 2024-05-12 LAB — URINALYSIS, ROUTINE W REFLEX MICROSCOPIC
Bilirubin, UA: NEGATIVE
Glucose, UA: NEGATIVE
Ketones, UA: NEGATIVE
Nitrite, UA: NEGATIVE
Protein,UA: NEGATIVE
RBC, UA: NEGATIVE
Specific Gravity, UA: 1.014 (ref 1.005–1.030)
Urobilinogen, Ur: 0.2 mg/dL (ref 0.2–1.0)
pH, UA: 6.5 (ref 5.0–7.5)

## 2024-05-12 LAB — MICROSCOPIC EXAMINATION
Casts: NONE SEEN /LPF
RBC, Urine: NONE SEEN /HPF (ref 0–2)

## 2024-05-12 LAB — HCV INTERPRETATION

## 2024-05-12 LAB — HEMOGLOBIN A1C
Est. average glucose Bld gHb Est-mCnc: 108 mg/dL
Hgb A1c MFr Bld: 5.4 % (ref 4.8–5.6)

## 2024-05-13 LAB — CULTURE, OB URINE

## 2024-05-13 LAB — URINE CULTURE, OB REFLEX

## 2024-05-15 ENCOUNTER — Ambulatory Visit: Payer: Self-pay | Admitting: Advanced Practice Midwife

## 2024-05-15 ENCOUNTER — Other Ambulatory Visit: Payer: Self-pay | Admitting: Advanced Practice Midwife

## 2024-05-15 DIAGNOSIS — A5901 Trichomonal vulvovaginitis: Secondary | ICD-10-CM

## 2024-05-15 LAB — CYTOLOGY - PAP
Chlamydia: NEGATIVE
Comment: NEGATIVE
Comment: NEGATIVE
Comment: NEGATIVE
Comment: NORMAL
High risk HPV: NEGATIVE
Neisseria Gonorrhea: NEGATIVE
Trichomonas: POSITIVE — AB

## 2024-05-15 MED ORDER — METRONIDAZOLE 500 MG PO TABS
2000.0000 mg | ORAL_TABLET | Freq: Once | ORAL | 0 refills | Status: AC
Start: 2024-05-15 — End: 2024-05-15

## 2024-05-15 NOTE — Progress Notes (Signed)
 Rx 2g metronidazole  sent to treat trich infection. Message to patient.

## 2024-05-16 LAB — MATERNIT 21 PLUS CORE, BLOOD
Fetal Fraction: 14
Result (T21): NEGATIVE
Trisomy 13 (Patau syndrome): NEGATIVE
Trisomy 18 (Edwards syndrome): NEGATIVE
Trisomy 21 (Down syndrome): NEGATIVE

## 2024-05-16 LAB — MONITOR DRUG PROFILE 14(MW)
Amphetamine Scrn, Ur: NEGATIVE ng/mL
BARBITURATE SCREEN URINE: NEGATIVE ng/mL
BENZODIAZEPINE SCREEN, URINE: NEGATIVE ng/mL
Buprenorphine, Urine: NEGATIVE ng/mL
Cocaine (Metab) Scrn, Ur: NEGATIVE ng/mL
Creatinine(Crt), U: 42.2 mg/dL (ref 20.0–300.0)
Fentanyl, Urine: NEGATIVE pg/mL
Meperidine Screen, Urine: NEGATIVE ng/mL
Methadone Screen, Urine: NEGATIVE ng/mL
OXYCODONE+OXYMORPHONE UR QL SCN: NEGATIVE ng/mL
Opiate Scrn, Ur: NEGATIVE ng/mL
Ph of Urine: 5.9 (ref 4.5–8.9)
Phencyclidine Qn, Ur: NEGATIVE ng/mL
Propoxyphene Scrn, Ur: NEGATIVE ng/mL
SPECIFIC GRAVITY: 1.01
Tramadol Screen, Urine: NEGATIVE ng/mL

## 2024-05-16 LAB — NICOTINE SCREEN, URINE: Cotinine Ql Scrn, Ur: NEGATIVE ng/mL

## 2024-05-16 LAB — CANNABINOID (GC/MS), URINE
Cannabinoid: POSITIVE — AB
Carboxy THC (GC/MS): 65 ng/mL

## 2024-05-26 ENCOUNTER — Other Ambulatory Visit

## 2024-05-29 ENCOUNTER — Other Ambulatory Visit

## 2024-05-30 NOTE — Telephone Encounter (Signed)
 The patient has picked it up

## 2024-06-02 ENCOUNTER — Telehealth: Payer: Self-pay

## 2024-06-02 NOTE — Telephone Encounter (Signed)
 Patient called triage with concerns about bleeding.  She reports using the bathroom at work and noticing a few drops of blood coming from her vagina.  She wiped and saw pink on the tissue.  She was able to bear down and cause a few more drops to come out.  She denies any pain, clots or tissue.  She reports having sex this morning at 9am.  Advised bleeding is likely from intercourse.  Discussed normal bleeding in early pregnancy.  Patient verbalized understanding and relief.  She will monitor for bleeding like a period or clots and tissue passing.

## 2024-06-07 DIAGNOSIS — Z3A15 15 weeks gestation of pregnancy: Secondary | ICD-10-CM | POA: Insufficient documentation

## 2024-06-07 NOTE — Progress Notes (Unsigned)
    Return Prenatal Note   Subjective   33 y.o. [name] at [redacted]w[redacted]d presents for this follow-up prenatal visit.  Patient reports two episodes of post-coital bleeding. Most recent episode was 2 days ago.  Patient reports: Movement: Absent Contractions: Not present  Objective   Flow sheet Vitals: Pulse Rate: 93 BP: 119/75 Fundal Height:  (16 wks) Fetal Heart Rate (bpm): 155 Total weight gain: 17 lb (7.711 kg)  General Appearance  No acute distress, well appearing, and well nourished Pulmonary   Normal work of breathing Neurologic   Alert and oriented to person, place, and time Psychiatric   Mood and affect within normal limits Pelvic    Large, parous cervix with thick, adherent discharge on the surface. Scant pink blood inside os. No ectropion noted.   Assessment/Plan   Plan  33 y.o. [name] at [redacted]w[redacted]d presents for follow-up OB visit. Reviewed prenatal record including previous visit note.  [redacted] weeks gestation of pregnancy -Reviewed common discomforts of pregnancy. -Plan anatomy scan in about 4 wks.  LGSIL on Pap smear of cervix 04/2024: LSIL pap, NEGATIVE for HR HPV [ ] PP Colpo    Trichomoniasis Trich on pap specimen at [facility]. Pt and partner were both Tx with metronidazole . [ ] TOC 9/18    Postcoital bleeding Bleeding after IC the last two times she's had sex. Completed metronidazole  for trich, as did her partner. Exam concerning for yeast. Swab sent for confirmation.        Orders Placed This Encounter  Procedures   US  OB Comp + 14 Wk    Standing Status:   Future    Expected Date:   07/07/2024    Expiration Date:   06/07/2025    Reason for Exam (SYMPTOM  OR DIAGNOSIS REQUIRED):   anatomy    Preferred Imaging Location?:   Internal   Return in about 4 weeks (around 07/06/2024) for ROB and anatomy scan.   Future Appointments  Date Time Provider Department Center  07/07/2024  8:15 AM [facility] US  1 [facility]OB[facility]G None  07/07/2024  9:35 AM Justino, Eleanor HERO, CNM [facility]  None     For next visit:  continue with routine prenatal care     Charma DOMINO, CNM  09/18/258:59 AM

## 2024-06-07 NOTE — Assessment & Plan Note (Signed)
-  Reviewed common discomforts of pregnancy. -Plan anatomy scan in about 4 wks.

## 2024-06-07 NOTE — Assessment & Plan Note (Signed)
 04/2024: LSIL pap, NEGATIVE for HR HPV [ ] PP Colpo

## 2024-06-07 NOTE — Assessment & Plan Note (Signed)
 Trich on pap specimen at [facility]. Tx with metronidazole . [ ] TOC 9/18

## 2024-06-08 ENCOUNTER — Other Ambulatory Visit (HOSPITAL_COMMUNITY)
Admission: RE | Admit: 2024-06-08 | Discharge: 2024-06-08 | Disposition: A | Discharge status: Home / Self Care | Source: Ambulatory Visit | Attending: Obstetrics | Admitting: Obstetrics

## 2024-06-08 ENCOUNTER — Ambulatory Visit: Admitting: Obstetrics

## 2024-06-08 ENCOUNTER — Encounter: Payer: Self-pay | Admitting: Obstetrics

## 2024-06-08 VITALS — BP 119/75 | HR 93 | Wt 137.0 lb

## 2024-06-08 DIAGNOSIS — A599 Trichomoniasis, unspecified: Secondary | ICD-10-CM | POA: Insufficient documentation

## 2024-06-08 DIAGNOSIS — O282 Abnormal cytological finding on antenatal screening of mother: Secondary | ICD-10-CM

## 2024-06-08 DIAGNOSIS — Z3A15 15 weeks gestation of pregnancy: Secondary | ICD-10-CM | POA: Diagnosis not present

## 2024-06-08 DIAGNOSIS — N930 Postcoital and contact bleeding: Secondary | ICD-10-CM | POA: Insufficient documentation

## 2024-06-08 DIAGNOSIS — Z3480 Encounter for supervision of other normal pregnancy, unspecified trimester: Secondary | ICD-10-CM

## 2024-06-08 DIAGNOSIS — R87612 Low grade squamous intraepithelial lesion on cytologic smear of cervix (LGSIL): Secondary | ICD-10-CM

## 2024-06-08 DIAGNOSIS — Z3689 Encounter for other specified antenatal screening: Secondary | ICD-10-CM

## 2024-06-08 DIAGNOSIS — O09893 Supervision of other high risk pregnancies, third trimester: Secondary | ICD-10-CM | POA: Diagnosis not present

## 2024-06-08 NOTE — Assessment & Plan Note (Addendum)
 Bleeding after IC the last two times she's had sex. Completed metronidazole  for trich, as did her partner. Exam concerning for yeast. Swab sent for confirmation.

## 2024-06-09 ENCOUNTER — Ambulatory Visit: Payer: Self-pay | Admitting: Registered Nurse

## 2024-06-09 DIAGNOSIS — B379 Candidiasis, unspecified: Secondary | ICD-10-CM

## 2024-06-09 DIAGNOSIS — A5901 Trichomonal vulvovaginitis: Secondary | ICD-10-CM

## 2024-06-09 LAB — CERVICOVAGINAL ANCILLARY ONLY
Candida Glabrata: NEGATIVE
Candida Vaginitis: POSITIVE — AB
Comment: NEGATIVE
Comment: NEGATIVE
Comment: NEGATIVE
Trichomonas: POSITIVE — AB

## 2024-06-09 MED ORDER — METRONIDAZOLE 500 MG PO TABS: 500.0000 mg | ORAL_TABLET | Freq: Two times a day (BID) | ORAL | 0 refills | Status: AC

## 2024-06-09 MED ORDER — MICONAZOLE 7 100 MG VA SUPP: 100.0000 mg | Freq: Every day | VAGINAL | 0 refills | Status: DC

## 2024-06-09 MED ORDER — FLUCONAZOLE 150 MG PO TABS: 150.0000 mg | ORAL_TABLET | Freq: Once | ORAL | 1 refills | Status: AC

## 2024-07-07 ENCOUNTER — Ambulatory Visit

## 2024-07-07 ENCOUNTER — Ambulatory Visit (INDEPENDENT_AMBULATORY_CARE_PROVIDER_SITE_OTHER): Admitting: Obstetrics

## 2024-07-07 ENCOUNTER — Encounter: Payer: Self-pay | Admitting: Obstetrics

## 2024-07-07 VITALS — BP 108/65 | HR 102 | Wt 145.0 lb

## 2024-07-07 DIAGNOSIS — Z3480 Encounter for supervision of other normal pregnancy, unspecified trimester: Secondary | ICD-10-CM

## 2024-07-07 DIAGNOSIS — Z3689 Encounter for other specified antenatal screening: Secondary | ICD-10-CM | POA: Diagnosis not present

## 2024-07-07 DIAGNOSIS — A599 Trichomoniasis, unspecified: Secondary | ICD-10-CM | POA: Diagnosis not present

## 2024-07-07 DIAGNOSIS — Z3A20 20 weeks gestation of pregnancy: Secondary | ICD-10-CM

## 2024-07-07 DIAGNOSIS — Z3482 Encounter for supervision of other normal pregnancy, second trimester: Secondary | ICD-10-CM | POA: Diagnosis not present

## 2024-07-07 NOTE — Assessment & Plan Note (Addendum)
-  Anatomy US  today. Preliminary report shows normal anatomy. -Discussed risks of excessive weight gain in pregnancy. Reviewed areas in diet where improvements can be made. Recommend regular exercise, increased protein and water, minimizing candy and sugary drinks. Declined nutrition consult. -Reviewed danger signs and when to seek medical attention

## 2024-07-07 NOTE — Progress Notes (Signed)
    Return Prenatal Note   Assessment/Plan   Plan  33 y.o. [name] at [redacted]w[redacted]d presents for follow-up OB visit. Reviewed prenatal record including previous visit note.  Trichomoniasis -Completed treatment on Tuesday. Partner has also completed treatment. Will do TOC in 3-4 weeks. Advised condom use until TOC is negative. -Yeast symptoms and spotting have resolved  Supervision of other normal pregnancy, antepartum -Anatomy US  today. Preliminary report shows normal anatomy. -Discussed risks of excessive weight gain in pregnancy. Reviewed areas in diet where improvements can be made. Recommend regular exercise, increased protein and water, minimizing candy and sugary drinks. Declined nutrition consult. -Reviewed danger signs and when to seek medical attention   No orders of the defined types were placed in this encounter.  Return in about 4 weeks (around 08/04/2024).   Future Appointments  Date Time Provider Department Center  08/03/2024  8:15 AM Slaughterbeck, Damien, CNM [facility] None    For next visit:  Routine prenatal care    Subjective  Cathy Vaughn is feeling concerned about her weight gain (25 lbs so far). She reports that she eats a lot of candy and slushies as well as doubling her portions at meals. She does not exercise outside of work. She said that she gained less 30 lbs total in her prior pregnancies.  Movement: Present Contractions: Not present  Objective   Flow sheet Vitals: Pulse Rate: (!) 102 BP: 108/65 Fundal Height: 20 cm Fetal Heart Rate (bpm): 146 Total weight gain: 25 lb (11.3 kg)  General Appearance  No acute distress, well appearing, and well nourished Pulmonary   Normal work of breathing Neurologic   Alert and oriented to person, place, and time Psychiatric   Mood and affect within normal limits  Eleanor Canny, CNM 07/07/24 9:53 AM

## 2024-07-07 NOTE — Assessment & Plan Note (Addendum)
-  Completed treatment on Tuesday. Partner has also completed treatment. Will do TOC in 3-4 weeks. Advised condom use until TOC is negative. -Yeast symptoms and spotting have resolved

## 2024-08-01 NOTE — Progress Notes (Unsigned)
    Return Prenatal Note   Subjective   33 y.o. [name] at [redacted]w[redacted]d presents for this follow-up prenatal visit.  Patient no concerns Patient reports: Movement: Present Contractions: Irritability  Objective   Flow sheet Vitals: Pulse Rate: 74 BP: (!) 115/54 Fundal Height: 23 cm Fetal Heart Rate (bpm): 146 Total weight gain: 30 lb (13.6 kg)  General Appearance  No acute distress, well appearing, and well nourished Pulmonary   Normal work of breathing Neurologic   Alert and oriented to person, place, and time Psychiatric   Mood and affect within normal limits   Assessment/Plan   Plan  33 y.o. [name] at [redacted]w[redacted]d presents for follow-up OB visit. Reviewed prenatal record including previous visit note.  Trichomoniasis TOC collected today  Supervision of other normal pregnancy, antepartum Feeling well overall without concerns.  Reviewed red flag warning signs anticipatory guidance for upcoming prenatal care.        Orders Placed This Encounter  Procedures   28 Week RH+Panel    Standing Status:   Future    Expected Date:   09/05/2024    Expiration Date:   12/04/2024   No follow-ups on file.   Future Appointments  Date Time Provider Department Center  09/05/2024  8:20 AM [facility] None  09/05/2024  8:55 AM Ferdinand Revoir, Damien, CNM [facility] None    For next visit:  continue with routine prenatal care     Damien Parsley, CNM [facility] 08/03/2510:13 AM

## 2024-08-03 ENCOUNTER — Other Ambulatory Visit (HOSPITAL_COMMUNITY)
Admission: RE | Admit: 2024-08-03 | Discharge: 2024-08-03 | Disposition: A | Discharge status: Home / Self Care | Source: Ambulatory Visit | Attending: Certified Nurse Midwife | Admitting: Certified Nurse Midwife

## 2024-08-03 ENCOUNTER — Ambulatory Visit (INDEPENDENT_AMBULATORY_CARE_PROVIDER_SITE_OTHER): Admitting: Certified Nurse Midwife

## 2024-08-03 VITALS — BP 115/54 | HR 74 | Wt 150.0 lb

## 2024-08-03 DIAGNOSIS — Z114 Encounter for screening for human immunodeficiency virus [HIV]: Secondary | ICD-10-CM

## 2024-08-03 DIAGNOSIS — O98812 Other maternal infectious and parasitic diseases complicating pregnancy, second trimester: Secondary | ICD-10-CM | POA: Diagnosis not present

## 2024-08-03 DIAGNOSIS — Z113 Encounter for screening for infections with a predominantly sexual mode of transmission: Secondary | ICD-10-CM | POA: Insufficient documentation

## 2024-08-03 DIAGNOSIS — Z3A23 23 weeks gestation of pregnancy: Secondary | ICD-10-CM

## 2024-08-03 DIAGNOSIS — A599 Trichomoniasis, unspecified: Secondary | ICD-10-CM | POA: Diagnosis not present

## 2024-08-03 DIAGNOSIS — Z3480 Encounter for supervision of other normal pregnancy, unspecified trimester: Secondary | ICD-10-CM

## 2024-08-03 DIAGNOSIS — Z131 Encounter for screening for diabetes mellitus: Secondary | ICD-10-CM

## 2024-08-03 DIAGNOSIS — Z130 Encounter for screening for diseases of the blood and blood-forming organs and certain disorders involving the immune mechanism: Secondary | ICD-10-CM

## 2024-08-03 NOTE — Assessment & Plan Note (Signed)
-   TOC collected today

## 2024-08-03 NOTE — Assessment & Plan Note (Signed)
 Feeling well overall without concerns.  Reviewed red flag warning signs anticipatory guidance for upcoming prenatal care.

## 2024-08-04 LAB — CERVICOVAGINAL ANCILLARY ONLY
Comment: NEGATIVE
Trichomonas: NEGATIVE

## 2024-08-31 NOTE — Patient Instructions (Addendum)
 Third Trimester of Pregnancy  The third trimester of pregnancy is from week 28 through week 40. This is months 7 through 9. The third trimester is a time when your baby is growing fast. Body changes during your third trimester Your body continues to change during this time. The changes usually go away after your baby is born. Physical changes You will continue to gain weight. You may get stretch marks on your hips, belly, and breasts. Your breasts will keep growing and may hurt. A yellow fluid (colostrum) may leak from your breasts. This is the first milk you're making for your baby. Your hair may grow faster and get thicker. In some cases, you may get hair loss. Your belly button may stick out. You may have more swelling in your hands, face, or ankles. Health changes You may have heartburn. You may feel short of breath. This is caused by the uterus that is now bigger. You may have more aches in the pelvis, back, or thighs. You may have more tingling or numbness in your hands, arms, and legs. You may pee more often. You may have trouble pooping (constipation) or swollen veins in the butt that can itch or get painful (hemorrhoids). Other changes You may have more problems sleeping. You may notice the baby moving lower in your belly (dropping). You may have more fluid coming from your vagina. Your joints may feel loose, and you may have pain around your pelvic bone. Follow these instructions at home: Medicines Take medicines only as told by your health care provider. Some medicines are not safe during pregnancy. Your provider may change the medicines that you take. Do not take any medicines unless told to by your provider. Take a prenatal vitamin that has at least 600 micrograms (mcg) of folic acid. Do not use herbal medicines, illegal drugs, or medicines that are not approved by your provider. Eating and drinking While you're pregnant your body needs additional nutrition to help  support your growing baby. Talk with your provider about your nutritional needs. Activity Most women are able to exercise regularly during pregnancy. Exercise routines may need to change at the end of your pregnancy. Talk to your provider about your activities and exercise routine. Relieving pain and discomfort Rest often with your legs raised if you have leg cramps or low back pain. Take warm sitz baths to soothe pain from hemorrhoids. Use hemorrhoid cream if your provider says it's okay. Wear a good, supportive bra if your breasts hurt. Do not use hot tubs, steam rooms, or saunas. Do not douche. Do not use tampons or scented pads. Safety Talk to your provider before traveling far distances. Wear your seatbelt at all times when you're in a car. Talk to your provider if someone hits you, hurts you, or yells at you. Preparing for birth To prepare for your baby: Take childbirth and breastfeeding classes. Visit the hospital and tour the maternity area. Buy a rear-facing car seat. Learn how to install it in your car. General instructions Avoid cat litter boxes and soil used by cats. These things carry germs that can cause harm to your pregnancy and your baby. Do not drink alcohol, smoke, vape, or use products with nicotine  or tobacco in them. If you need help quitting, talk with your provider. Keep all follow-up visits for your third trimester. Your provider will do more exams and tests during this trimester. Write down your questions. Take them to your prenatal visits. Your provider also will: Talk with you about  your overall health. Give you advice or refer you to specialists who can help with different needs, including: Mental health and counseling. Foods and healthy eating. Ask for help if you need help with food. Where to find more information American Pregnancy Association: americanpregnancy.org [employer] of [employer]stetricians and Gynecologists: acog.org Office on [employer] Health:  travellesson.ca Contact a health care provider if: You have a headache that does not go away when you take medicine. You have any of these problems: You can't eat or drink. You have nausea and vomiting. You have watery poop (diarrhea) for 2 days or more. You have pain when you pee, or your pee smells bad. You have been sick for 2 days or more and aren't getting better. Contact your provider right away if: You have any of these coming from your vagina: Abnormal discharge. Bad-smelling fluid. Bleeding. Your baby is moving less than usual. You have signs of labor: You have any contractions, belly cramping, or have pain in your pelvis or lower back before 37 weeks of pregnancy (preterm labor). You have regular contractions that are less than 5 minutes apart. Your water breaks. You have symptoms of high blood pressure or preeclampsia. These include: A severe, throbbing headache that does not go away. Sudden or extreme swelling of your face, hands, legs, or feet. Vision problems: You see spots. You have blurry vision. Your eyes are sensitive to light. If you can't reach your provider, go to an urgent care or emergency room. Get help right away if: You faint, become confused, or can't think clearly. You have chest pain or trouble breathing. You have any kind of injury, such as from a fall or a car crash. These symptoms may be an emergency. Call 911 right away. Do not wait to see if the symptoms will go away. Do not drive yourself to the hospital. This information is not intended to replace advice given to you by your health care provider. Make sure you discuss any questions you have with your health care provider.  Fetal Movement Counts When you're pregnant, you might start feeling your baby move around the middle of your pregnancy. At first, these movements might feel like flutters, rolls, or swishes. As your baby grows, you might feel more kicks and jabs. Around week 28 of your  pregnancy, your health care team may ask you to count how often your baby moves. This is important for all pregnancies, but especially for high-risk ones. Counting movements can help lessen the risk of stillbirth. What is a fetal movement count? A fetal movement count is the number of times that you feel your baby move during a certain amount of time. This may also be called a kick count. There are many ways to do a kick count. Ask your team what is best for you. Pay attention to when your baby is most active. You may notice your baby's sleep and wake cycles. You may also notice things that make your baby move more. When you do a kick count, try to do it: When your baby is normally most active. At the same time each day. How do I count fetal movements?  Find a quiet, comfortable area. Sit or lie down. Write down the date, the start time, and the number of movements you feel. Count kicks, flutters, swishes, rolls, and jabs. Usually, you will feel at least 10 movements within 2 hours. Stop counting after you have felt 10 movements or if you have been counting for 2 hours. Write down  the stop time. Contact a health care provider if: You don't feel 10 movements in 2 hours. Your baby isn't moving as it usually does. Your baby isn't moving at all. If you're not able to reach your provider, go to an emergency room. This information is not intended to replace advice given to you by your health care provider. Make sure you discuss any questions you have with your health care provider. Document Revised: 10/01/2023 Document Reviewed: 09/23/2022 Elsevier Patient Education  2025 [employer].  Tdap (Tetanus, Diphtheria, Pertussis) Vaccine: What You Need to Know Many vaccine information statements are available in Spanish and other languages. See promoage.com.br. 1. Why get vaccinated? Tdap vaccine can prevent tetanus, diphtheria, and pertussis. Diphtheria and pertussis spread from person to person.  Tetanus enters the body through cuts or wounds. TETANUS (T) causes painful stiffening of the muscles. Tetanus can lead to serious health problems, including being unable to open the mouth, having trouble swallowing and breathing, or death. DIPHTHERIA (D) can lead to difficulty breathing, heart failure, paralysis, or death. PERTUSSIS (aP), also known as whooping cough, can cause uncontrollable, violent coughing that makes it hard to breathe, eat, or drink. Pertussis can be extremely serious especially in babies and young children, causing pneumonia, convulsions, brain damage, or death. In teens and adults, it can cause weight loss, loss of bladder control, passing out, and rib fractures from severe coughing. 2. Tdap vaccine Tdap is only for children 7 years and older, adolescents, and adults.  Adolescents should receive a single dose of Tdap, preferably at age 64 or 12 years. Pregnant people should get a dose of Tdap during every pregnancy, preferably during the early part of the third trimester, to help protect the newborn from pertussis. Infants are most at risk for severe, life-threatening complications from pertussis. Adults who have never received Tdap should get a dose of Tdap. Also, adults should receive a booster dose of either Tdap or Td (a different vaccine that protects against tetanus and diphtheria but not pertussis) every 10 years, or after 5 years in the case of a severe or dirty wound or burn. Tdap may be given at the same time as other vaccines. 3. Talk with your health care provider Tell your vaccine provider if the person getting the vaccine: Has had an allergic reaction after a previous dose of any vaccine that protects against tetanus, diphtheria, or pertussis, or has any severe, life-threatening allergies Has had a coma, decreased level of consciousness, or prolonged seizures within 7 days after a previous dose of any pertussis vaccine (DTP, DTaP, or Tdap) Has seizures or  another nervous system problem Has ever had Guillain-Barr Syndrome (also called GBS) Has had severe pain or swelling after a previous dose of any vaccine that protects against tetanus or diphtheria In some cases, your health care provider may decide to postpone Tdap vaccination until a future visit. People with minor illnesses, such as a cold, may be vaccinated. People who are moderately or severely ill should usually wait until they recover before getting Tdap vaccine.  Your health care provider can give you more information. 4. Risks of a vaccine reaction Pain, redness, or swelling where the shot was given, mild fever, headache, feeling tired, and nausea, vomiting, diarrhea, or stomachache sometimes happen after Tdap vaccination. People sometimes faint after medical procedures, including vaccination. Tell your provider if you feel dizzy or have vision changes or ringing in the ears.  As with any medicine, there is a very remote chance of a  vaccine causing a severe allergic reaction, other serious injury, or death. 5. What if there is a serious problem? An allergic reaction could occur after the vaccinated person leaves the clinic. If you see signs of a severe allergic reaction (hives, swelling of the face and throat, difficulty breathing, a fast heartbeat, dizziness, or weakness), call 9-1-1 and get the person to the nearest hospital. For other signs that concern you, call your health care provider.  Adverse reactions should be reported to the Vaccine Adverse Event Reporting System (VAERS). Your health care provider will usually file this report, or you can do it yourself. Visit the VAERS website at www.vaers.lagents.no or call 671 412 7827. VAERS is only for reporting reactions, and VAERS staff members do not give medical advice. 6. The National Vaccine Injury Compensation Program The [employer] Vaccine Injury Compensation Program (VICP) is a federal program that was created to compensate people  who may have been injured by certain vaccines. Claims regarding alleged injury or death due to vaccination have a time limit for filing, which may be as short as two years. Visit the VICP website at spiritualword.at or call (660)379-2480 to learn about the program and about filing a claim. 7. How can I learn more? Ask your health care provider. Call your local or state health department. Visit the website of the Food and Drug Administration (FDA) for vaccine package inserts and additional information at finderlist.no. Contact the Centers for Disease Control and Prevention (CDC): Call (501)026-2400 (1-800-CDC-INFO) or Visit CDC's website at piccapture.uy. Source: CDC Vaccine Information Statement Tdap (Tetanus, Diphtheria, Pertussis) Vaccine (04/26/2020) This same material is available at footballexhibition.com.br for no charge. This information is not intended to replace advice given to you by your health care provider. Make sure you discuss any questions you have with your health care provider. Document Revised: 12/23/2022 Document Reviewed: 10/23/2022 Elsevier Patient Education  2024 [employer].

## 2024-08-31 NOTE — Progress Notes (Unsigned)
 tdap   Return Prenatal Note   Subjective   33 y.o. [name] at [redacted]w[redacted]d presents for this follow-up prenatal visit.  Patient *** Patient reports:    Objective   Flow sheet Vitals:   Total weight gain: 30 lb (13.6 kg)  General Appearance  No acute distress, well appearing, and well nourished Pulmonary   Normal work of breathing Neurologic   Alert and oriented to person, place, and time Psychiatric   Mood and affect within normal limits   Assessment/Plan   Plan  33 y.o. [name] at [redacted]w[redacted]d presents for follow-up OB visit. Reviewed prenatal record including previous visit note.  No problem-specific Assessment & Plan notes found for this encounter.      No orders of the defined types were placed in this encounter.  No follow-ups on file.   Future Appointments  Date Time Provider Department Center  09/05/2024  8:20 AM [facility] LAB [facility] None  09/05/2024  8:55 AM Slaughterbeck, Damien, CNM [facility] None    For next visit:  {[email or web address]}     Camelia LITTIE Fetters, CMA  12/11/20254:27 PM

## 2024-09-05 ENCOUNTER — Ambulatory Visit: Payer: Self-pay | Admitting: Certified Nurse Midwife

## 2024-09-05 ENCOUNTER — Other Ambulatory Visit (HOSPITAL_COMMUNITY)
Admission: RE | Admit: 2024-09-05 | Discharge: 2024-09-05 | Disposition: A | Discharge status: Home / Self Care | Payer: Self-pay | Source: Ambulatory Visit | Attending: Certified Nurse Midwife | Admitting: Certified Nurse Midwife

## 2024-09-05 ENCOUNTER — Other Ambulatory Visit: Payer: Self-pay

## 2024-09-05 ENCOUNTER — Encounter: Payer: Self-pay | Admitting: Certified Nurse Midwife

## 2024-09-05 VITALS — BP 107/60 | HR 91 | Wt 151.2 lb

## 2024-09-05 DIAGNOSIS — Z3480 Encounter for supervision of other normal pregnancy, unspecified trimester: Secondary | ICD-10-CM | POA: Insufficient documentation

## 2024-09-05 DIAGNOSIS — Z114 Encounter for screening for human immunodeficiency virus [HIV]: Secondary | ICD-10-CM

## 2024-09-05 DIAGNOSIS — N898 Other specified noninflammatory disorders of vagina: Secondary | ICD-10-CM | POA: Insufficient documentation

## 2024-09-05 DIAGNOSIS — Z113 Encounter for screening for infections with a predominantly sexual mode of transmission: Secondary | ICD-10-CM

## 2024-09-05 DIAGNOSIS — Z23 Encounter for immunization: Secondary | ICD-10-CM

## 2024-09-05 DIAGNOSIS — Z131 Encounter for screening for diabetes mellitus: Secondary | ICD-10-CM

## 2024-09-05 DIAGNOSIS — Z130 Encounter for screening for diseases of the blood and blood-forming organs and certain disorders involving the immune mechanism: Secondary | ICD-10-CM

## 2024-09-05 NOTE — Progress Notes (Signed)
° ° °  Return Prenatal Note   Subjective   33 y.o. [name] at [redacted]w[redacted]d presents for this follow-up prenatal visit.  Patient is doing well. She has been having lots of pressure and cramps more frequently. She reports good fetal movement. TDAP/BTC/Medicaid done today Patient reports: Movement: Present Contractions: Not present  Objective   Flow sheet Vitals: Pulse Rate: 91 BP: 107/60 Fundal Height: 28 cm Fetal Heart Rate (bpm): 150 Total weight gain: 31 lb 3.2 oz (14.2 kg)  General Appearance  No acute distress, well appearing, and well nourished Pulmonary   Normal work of breathing Neurologic   Alert and oriented to person, place, and time Psychiatric   Mood and affect within normal limits   Assessment/Plan   Plan  33 y.o. [name] at [redacted]w[redacted]d presents for follow-up OB visit. Reviewed prenatal record including previous visit note.  Supervision of other normal pregnancy, antepartum Reports a lot of cramping and some increased discharge. Reports some bleeding on Sunday. Wet prep negative on microscope. Will send swab. Reviewed when to go to triage. 28 week labs drawn today. RhIG n/a RPR, CBC, HIV and 1-hour GTT sent Reviewed the importance of monitoring fetal movement as well as warning signs for pre-eclampsia and preterm labor.       Orders Placed This Encounter  Procedures   Tdap vaccine greater than or equal to 7yo IM   Return in about 4 weeks (around 10/03/2024) for routine prenatal care.   Future Appointments  Date Time Provider Department Center  09/22/2024  9:35 AM Justino Eleanor HERO, CNM [facility] None     For next visit:  continue with routine prenatal care     Damien Parsley, CNM [facility] 12/16/202511:49 AM

## 2024-09-05 NOTE — Assessment & Plan Note (Signed)
 Reports a lot of cramping and some increased discharge. Reports some bleeding on Sunday. Wet prep negative on microscope. Will send swab. Reviewed when to go to triage. 28 week labs drawn today. RhIG n/a RPR, CBC, HIV and 1-hour GTT sent Reviewed the importance of monitoring fetal movement as well as warning signs for pre-eclampsia and preterm labor.

## 2024-09-06 ENCOUNTER — Other Ambulatory Visit: Payer: Self-pay | Admitting: Obstetrics

## 2024-09-06 ENCOUNTER — Telehealth: Payer: Self-pay

## 2024-09-06 LAB — CERVICOVAGINAL ANCILLARY ONLY
Bacterial Vaginitis (gardnerella): NEGATIVE
Candida Glabrata: NEGATIVE
Candida Vaginitis: POSITIVE — AB
Chlamydia: NEGATIVE
Comment: NEGATIVE
Comment: NEGATIVE
Comment: NEGATIVE
Comment: NEGATIVE
Comment: NEGATIVE
Comment: NORMAL
Neisseria Gonorrhea: NEGATIVE
Trichomonas: NEGATIVE

## 2024-09-06 LAB — 28 WEEK RH+PANEL
Basophils Absolute: 0 x10E3/uL (ref 0.0–0.2)
Basos: 0 %
EOS (ABSOLUTE): 0 x10E3/uL (ref 0.0–0.4)
Eos: 0 %
Gestational Diabetes Screen: 138 mg/dL (ref 70–139)
HIV Screen 4th Generation wRfx: NONREACTIVE
Hematocrit: 33.1 % — ABNORMAL LOW (ref 34.0–46.6)
Hemoglobin: 10.8 g/dL — ABNORMAL LOW (ref 11.1–15.9)
Immature Grans (Abs): 0 x10E3/uL (ref 0.0–0.1)
Immature Granulocytes: 0 %
Lymphocytes Absolute: 1.4 x10E3/uL (ref 0.7–3.1)
Lymphs: 25 %
MCH: 29 pg (ref 26.6–33.0)
MCHC: 32.6 g/dL (ref 31.5–35.7)
MCV: 89 fL (ref 79–97)
Monocytes Absolute: 0.3 x10E3/uL (ref 0.1–0.9)
Monocytes: 6 %
Neutrophils Absolute: 3.8 x10E3/uL (ref 1.4–7.0)
Neutrophils: 69 %
Platelets: 260 x10E3/uL (ref 150–450)
RBC: 3.72 x10E6/uL — ABNORMAL LOW (ref 3.77–5.28)
RDW: 12.4 % (ref 11.7–15.4)
RPR Ser Ql: NONREACTIVE
WBC: 5.6 x10E3/uL (ref 3.4–10.8)

## 2024-09-06 MED ORDER — FLUCONAZOLE 150 MG PO TABS: 150.0000 mg | ORAL_TABLET | Freq: Once | ORAL | 0 refills | Status: AC

## 2024-09-06 NOTE — Telephone Encounter (Signed)
 Cathy Vaughn called triage and left voicemail stating she got her test results and would like to speak to a midwife or MD to go over the results.

## 2024-09-06 NOTE — Progress Notes (Signed)
 Discussed lab results with Peggie. Encouraged healthy dietary choices, iron supplement every other day. Rx sent for Diflucan .  Jayln Branscom M Devri Kreher, CNM

## 2024-09-07 ENCOUNTER — Ambulatory Visit: Payer: Self-pay | Admitting: Certified Nurse Midwife

## 2024-09-20 NOTE — Progress Notes (Unsigned)
" ° ° °  Return Prenatal Note   Assessment/Plan   Plan  33 y.o. [name] at [redacted]w[redacted]d presents for follow-up OB visit. Reviewed prenatal record including previous visit note.  Supervision of other normal pregnancy, antepartum -Discussed making a birth plan. Plans epidural. Informed that we do not offer elective IOLs at this time. -Rx sent for pantoprazole. Encouraged small, frequent meals -Undecided about contraception. Considering another child. Link sent to [email or web address] -Reviewed kick counts and preterm labor warning signs. Instructed to call office or come to hospital with persistent headache, vision changes, regular contractions, leaking of fluid, decreased fetal movement or vaginal bleeding.     No orders of the defined types were placed in this encounter.  Return in about 2 weeks (around 10/06/2024).   Future Appointments  Date Time Provider Department Center  10/10/2024  9:35 AM Slaughterbeck, Damien, CNM [facility] None    For next visit:  Routine prenatal care    Subjective   Cathy Vaughn is having lots of cramps/BH ctx. She took Diflucan  and that improved her yeast symptoms. She has stopped THC use but is now struggling with appetite and reflux. Thinking about her birth plan - she had an elective IOL during COVID with her last baby but is open to all options.  Movement: Present Contractions: Irritability  Objective   Flow sheet Vitals: Pulse Rate: 81 BP: 93/62 Fundal Height: 30 cm Fetal Heart Rate (bpm): 138 Total weight gain: 31 lb (14.1 kg)  General Appearance  No acute distress, well appearing, and well nourished Pulmonary   Normal work of breathing Neurologic   Alert and oriented to person, place, and time Psychiatric   Mood and affect within normal limits  Cathy Vaughn, CNM 09/22/2024 10:11 AM  "

## 2024-09-22 ENCOUNTER — Ambulatory Visit: Payer: Self-pay | Admitting: Obstetrics

## 2024-09-22 ENCOUNTER — Encounter: Payer: Self-pay | Admitting: Obstetrics

## 2024-09-22 VITALS — BP 93/62 | HR 81 | Wt 151.0 lb

## 2024-09-22 DIAGNOSIS — Z3A30 30 weeks gestation of pregnancy: Secondary | ICD-10-CM

## 2024-09-22 DIAGNOSIS — Z3480 Encounter for supervision of other normal pregnancy, unspecified trimester: Secondary | ICD-10-CM

## 2024-09-22 DIAGNOSIS — Z3483 Encounter for supervision of other normal pregnancy, third trimester: Secondary | ICD-10-CM

## 2024-09-22 MED ORDER — PANTOPRAZOLE SODIUM 20 MG PO TBEC: 20.0000 mg | DELAYED_RELEASE_TABLET | Freq: Every day | ORAL | 1 refills | Status: DC

## 2024-09-22 NOTE — Assessment & Plan Note (Signed)
-  Discussed making a birth plan. Plans epidural. Informed that we do not offer elective IOLs at this time. -Rx sent for pantoprazole. Encouraged small, frequent meals -Undecided about contraception. Considering another child. Link sent to [email or web address] -Reviewed kick counts and preterm labor warning signs. Instructed to call office or come to hospital with persistent headache, vision changes, regular contractions, leaking of fluid, decreased fetal movement or vaginal bleeding.

## 2024-10-06 NOTE — Patient Instructions (Addendum)
 RSV (Respiratory Syncytial Virus) Vaccine: What You Need to Know (CDC VIS) Many Vaccine Information Statements are available in Spanish and other languages. See inrails.tn To view this CDC Vaccine Information Statement in English, click the link below or scan the QR code: https://pe.elsevier.com/IJ5TlbjW  This information is not intended to replace advice given to you by your health care provider. Make sure you discuss any questions you have with your health care provider. Vaccine Information Statements (VISs) are third - party content from [facility] & Immunize.org and Elsevier is not responsible for the content.     Immunize.org Market researcher.     Elsevier Patient Education  [employer].  Commonly Asked Questions During Pregnancy  Cats: A parasite can be excreted in cat feces.  To avoid exposure you need to have another person empty the little box.  If you must empty the litter box you will need to wear gloves.  Wash your hands after handling your cat.  This parasite can also be found in raw or undercooked meat so this should also be avoided.  Colds, Sore Throats, Flu: Please check your medication sheet to see what you can take for symptoms.  If your symptoms are unrelieved by these medications please call the office.  Dental Work: Most any dental work agricultural consultant recommends is permitted.  X-rays should only be taken during the first trimester if absolutely necessary.  Your abdomen should be shielded with a lead apron during all x-rays.  Please notify your provider prior to receiving any x-rays.  Novocaine is fine; gas is not recommended.  If your dentist requires a note from us  prior to dental work please call the office and we will provide one for you.  Exercise: Exercise is an important part of staying healthy during your pregnancy.  You may continue most exercises you were accustomed to prior to pregnancy.  Later in your pregnancy you will most likely  notice you have difficulty with activities requiring balance like riding a bicycle.  It is important that you listen to your body and avoid activities that put you at a higher risk of falling.  Adequate rest and staying well hydrated are a must!  If you have questions about the safety of specific activities ask your provider.    Exposure to Children with illness: Try to avoid obvious exposure; report any symptoms to us  when noted,  If you have chicken pos, red measles or mumps, you should be immune to these diseases.   Please do not take any vaccines while pregnant unless you have checked with your OB provider.  Fetal Movement: After 28 weeks we recommend you do kick counts twice daily.  Lie or sit down in a calm quiet environment and count your baby movements kicks.  You should feel your baby at least 10 times per hour.  If you have not felt 10 kicks within the first hour get up, walk around and have something sweet to eat or drink then repeat for an additional hour.  If count remains less than 10 per hour notify your provider.  Fumigating: Follow your pest control agent's advice as to how long to stay out of your home.  Ventilate the area well before re-entering.  Hemorrhoids:   Most over-the-counter preparations can be used during pregnancy.  Check your medication to see what is safe to use.  It is important to use a stool softener or fiber in your diet and to drink lots of liquids.  If hemorrhoids seem to  be getting worse please call the office.   Hot Tubs:  Hot tubs Jacuzzis and saunas are not recommended while pregnant.  These increase your internal body temperature and should be avoided.  Intercourse:  Sexual intercourse is safe during pregnancy as long as you are comfortable, unless otherwise advised by your provider.  Spotting may occur after intercourse; report any bright red bleeding that is heavier than spotting.  Labor:  If you know that you are in labor, please go to the hospital.  If  you are unsure, please call the office and let us  help you decide what to do.  Lifting, straining, etc:  If your job requires heavy lifting or straining please check with your provider for any limitations.  Generally, you should not lift items heavier than that you can lift simply with your hands and arms (no back muscles)  Painting:  Paint fumes do not harm your pregnancy, but may make you ill and should be avoided if possible.  Latex or water based paints have less odor than oils.  Use adequate ventilation while painting.  Permanents & Hair Color:  Chemicals in hair dyes are not recommended as they cause increase hair dryness which can increase hair loss during pregnancy.   Highlighting and permanents are allowed.  Dye may be absorbed differently and permanents may not hold as well during pregnancy.  Sunbathing:  Use a sunscreen, as skin burns easily during pregnancy.  Drink plenty of fluids; avoid over heating.  Tanning Beds:  Because their possible side effects are still unknown, tanning beds are not recommended.  Ultrasound Scans:  Routine ultrasounds are performed at approximately 20 weeks.  You will be able to see your baby's general anatomy an if you would like to know the gender this can usually be determined as well.  If it is questionable when you conceived you may also receive an ultrasound early in your pregnancy for dating purposes.  Otherwise ultrasound exams are not routinely performed unless there is a medical necessity.  Although you can request a scan we ask that you pay for it when conducted because insurance does not cover  patient request scans.  Work: If your pregnancy proceeds without complications you may work until your due date, unless your physician or employer advises otherwise.  Round Ligament Pain/Pelvic Discomfort:  Sharp, shooting pains not associated with bleeding are fairly common, usually occurring in the second trimester of pregnancy.  They tend to be worse  when standing up or when you remain standing for long periods of time.  These are the result of pressure of certain pelvic ligaments called round ligaments.  Rest, Tylenol  and heat seem to be the most effective relief.  As the womb and fetus grow, they rise out of the pelvis and the discomfort improves.  Please notify the office if your pain seems different than that described.  It may represent a more serious condition.  Fetal Movement Counts  When you're pregnant, you might start feeling your baby move around the middle of your pregnancy. At first, these movements might feel like flutters, rolls, or swishes. As your baby grows, you might feel more kicks and jabs. Around week 28 of your pregnancy, your health care team may ask you to count how often your baby moves. This is important for all pregnancies, but especially for high-risk ones. Counting movements can help lessen the risk of stillbirth. What is a fetal movement count? A fetal movement count is the number of times that you  feel your baby move during a certain amount of time. This may also be called a kick count. There are many ways to do a kick count. Ask your team what is best for you. Pay attention to when your baby is most active. You may notice your baby's sleep and wake cycles. You may also notice things that make your baby move more. When you do a kick count, try to do it: When your baby is normally most active. At the same time each day. How do I count fetal movements?  Find a quiet, comfortable area. Sit or lie down. Write down the date, the start time, and the number of movements you feel. Count kicks, flutters, swishes, rolls, and jabs. Usually, you will feel at least 10 movements within 2 hours. Stop counting after you have felt 10 movements or if you have been counting for 2 hours. Write down the stop time. Contact a health care provider if: You don't feel 10 movements in 2 hours. Your baby isn't moving as it usually  does. Your baby isn't moving at all. If you're not able to reach your provider, go to an emergency room. This information is not intended to replace advice given to you by your health care provider. Make sure you discuss any questions you have with your health care provider. Document Revised: 10/01/2023 Document Reviewed: 09/23/2022 Elsevier Patient Education  2025 [employer].

## 2024-10-06 NOTE — Progress Notes (Signed)
" ° ° °  Return Prenatal Note   Subjective   34 y.o. Cathy Vaughn at [redacted]w[redacted]d presents for this follow-up prenatal visit.  Patient reports chest pain and sob, she was evaluated at ED last night.  Patient reports: Movement: Present Contractions: Irritability  Objective   Flow sheet Vitals: Pulse Rate: 89 BP: 103/70 Fundal Height: 33 cm Fetal Heart Rate (bpm): 140 Total weight gain: 33 lb 12.8 oz (15.3 kg)  General Appearance  No acute distress, well appearing, and well nourished Pulmonary   Normal work of breathing Neurologic   Alert and oriented to person, place, and time Psychiatric   Mood and affect within normal limits   Assessment/Plan   Plan  34 y.o. Cathy Vaughn at [redacted]w[redacted]d presents for follow-up OB visit. Reviewed prenatal record including previous visit note.  Supervision of other normal pregnancy, antepartum Felt SOB and palpitations while cooking breakfast yesterday. She went to the hospital and all emergency cardiac reasons ruled out. CT scan was negative. Abdominal US  was negative acute processes. She feels better than yesterday but still SOB but closer to baseline. Will order cardiac referral if symptoms persist.  Baby was transverse lie on US  yesterday at hospital. Feels cephalic with Leopolds today. Feels as though she is unable to empty bladder fully after she urinates. No pain or burning with urination. Some abdominal cramping.  Reviewed kick counts and preterm labor warning signs. Instructed to call office or come to hospital with persistent headache, vision changes, regular contractions, leaking of fluid, decreased fetal movement or vaginal bleeding.       Orders Placed This Encounter  Procedures   Urine Culture   Respiratory syncytial virus vaccine, preF, subunit, bivalent,(Abrysvo)   No follow-ups on file.   No future appointments.   For next visit:  continue with routine prenatal care    Damien Parsley, CNM [facility] 10/10/2608:28  AM "

## 2024-10-09 ENCOUNTER — Telehealth: Payer: Self-pay

## 2024-10-09 ENCOUNTER — Other Ambulatory Visit: Payer: Self-pay | Admitting: Licensed Practical Nurse

## 2024-10-09 NOTE — ED Provider Notes (Signed)
 ------------------------------------------------------------------------------- Attestation signed by Ludie Primmer, DO at 10/09/24 1826 I attest that I was the Attending Physician in the Emergency Department during this patient's visit. The patient was seen and managed by the Advanced Practice Clinician independently. I did not personally evaluate the patient, though was present and immediately available for consultation as needed. I have reviewed the chart.   Ludie Primmer, D.O.  -------------------------------------------------------------------------------   [facility]  ED Provider Note  Cathy Vaughn 34 y.o. female DOB: August 11, 1991 MRN: [id number] History   Chief Complaint  Patient presents with   Abdominal Pain    Abd pain into chest [redacted] weeks pregnant thinks she may be leaking fluid. Started after cooking breakfast more constant pain    Shortness of Breath   HPI  Patient who is G5, P2 and currently [redacted] weeks pregnant presents to ED for chest pain.  Patient stated that earlier this morning around 10 to 11 AM she began to experience midsternal chest pain and some associated shortness of breath.  Patient stated that since then the symptoms have persisted.  Patient has not taken any medications for this and has not experienced this sensation and previous pregnancies.  Patient stated that prior pregnancies were uncomplicated.  Has not had some Braxton Hicks contractions when I asked her what that meant to her she stated that she has felt like her abdomen felt tight in the mid to lower abdomen.  Patient stated that necessarily was not painful just uncomfortable.  Patient stated that in addition to the symptoms she also had some vaginal discharge clear in nature.  Patient stated that she has not noticed any vaginal blood.  Patient denies any headache, acute vision changes, focal neurologic deficits.  Patient denies any history of COPD or asthma.    History  reviewed. No pertinent past medical history.  History reviewed. No pertinent surgical history.  Social History   Substance and Sexual Activity  Alcohol Use Never   Tobacco Use History[1] E-Cigarettes   Vaping Use Never User    Start Date     Cartridges/Day     Quit Date     Social History   Substance and Sexual Activity  Drug Use Never         Allergies[2]  Current Discharge Medication List     CONTINUE these medications which have NOT CHANGED   Details  pantoprazole  sodium (PROTONIX ) 20 mg tablet Take one tablet (20 mg dose) by mouth daily.        Primary Survey  Primary Survey  Review of Systems   Review of Systems  All relevant ROS reviewed as seen in HPI above.  Physical Exam   ED Triage Vitals [10/09/24 1351]  BP 106/74  Heart Rate 97  Resp 18  SpO2 100 %  Temp 97.8 F (36.6 C)    Physical Exam  HENT:  Head: Normocephalic and atraumatic.  Eyes: EOM are intact. Pupils are equal, round, and reactive to light.  Neurological: She is alert. Moves all extremities equally. She has normal speech.    Patient is Well-appearing, in no acute distress, and appears stated age.  Vitals within normal limits.  Patient is afebrile  Patient's heart sounds are normal, normal rate and rhythm, no peripheral edema noted, intact pulses.   Breath sounds normal lungs were clear to auscultation bilaterally.   ED Course   Lab results:   CBC AND DIFFERENTIAL - Abnormal      Result Value   WBC 6.4  RBC 4.08     HGB 11.1 (*)    HCT 33.8 (*)    MCV 82.8     MCH 27.2     MCHC 32.8     Plt Ct 277     RDW SD 38.8     MPV 10.7     NRBC% 0.0     Absolute NRBC Count 0.00     NEUTROPHIL % 59.2     LYMPHOCYTE % 28.9     MONOCYTE % 10.6     Eosinophil % 0.3     BASOPHIL % 0.5     IG% 0.5     ABSOLUTE NEUTROPHIL COUNT 3.82     ABSOLUTE LYMPHOCYTE COUNT 1.86     Absolute Monocyte Count 0.68     Absolute Eosinophil Count 0.02     Absolute Basophil  Count 0.03     Absolute Immature Granulocyte Count 0.03    COMPREHENSIVE METABOLIC PANEL - Abnormal   Na 135 (*)    Potassium 3.6 (*)    Cl 104     CO2 19 (*)    AGAP 12     Glucose 92     BUN 5 (*)    Creatinine 0.46 (*)    Ca 10.2     ALK PHOS 158 (*)    T Bili 0.3     Total Protein 7.1     Alb 3.7     GLOBULIN 3.4     ALBUMIN/GLOBULIN RATIO 1.1     BUN/CREAT RATIO 11     ALT 16     AST 19     eGFR 130     Comment: Normal GFR (glomerular filtration rate) > 60 mL/min/1.73 meters squared, < 60 may include impaired kidney function. Calculation based on the Chronic Kidney Disease Epidemiology Collaboration (CK-EPI)equation refit without adjustment for race.  D-DIMER, QUANTITATIVE - Abnormal   D-Dimer 0.82 (*)    Comment: The results of D-Dimer testing should be evaluated in the context of all clinical and laboratory data available. D-Dimer cut off value is 0.50 ug/ml FEU. D-Dimer assay can be used to exclude DVT & PE, but should not be used to exclude patients with: - Therapeutic dose anticoagulant therapy for >24 hours.  - Fibrinolytic therapy within previous 7 days  - Trauma or surgery within previous 4 weeks  - Disseminated malignancies  - Aortic aneurysm  - Sepsis, severe infections, pneumonia, severe skin infections  - Liver cirrhosis  - Pregnancy   GEN5 CARDIAC TROPONIN T (TNT5) BASELINE - Normal   TnT-Gen5 (0hr) 5     Comment: Assay results < 6 ng/L and > 10,000 ng/L are reported as 5 ng/L and 10,001 ng/L respectively to reflect the absolute reportable range of TnTGen5.  An elevated Troponin indicates myocardial damage. Elevated troponin may also be due to pulmonary emboli, aortic dissection, heart failure, trauma, toxins and ischemia in the setting of critical illness.   MAGNESIUM - Normal   Mg 1.6    TSH - Normal   TSH 1.35    NT-PROBNP - Normal   NT-ProBNP <36     Comment: Among patients presenting with dyspnea, NT-proBNP is highly sensitive for the detection  of acute decompensated heart failure (ADHF). In addition, a NT-proBNP when used with the recommended age-independent exclusionary cut-point of <300 pg/mL effectively rules out ADHF with a negative predictive value (NPV) ranging from 97.7% for males to 98.5% for females.  GEN5 CARDIAC TROPONIN T(TNT5) 1 HOUR - Normal  TnT-Gen5 (1hr) 5     Comment: Assay results < 6 ng/L and > 10,000 ng/L are reported as 5 ng/L and 10,001 ng/L respectively to reflect the absolute reportable range of TnTGen5.  An elevated Troponin indicates myocardial damage. Elevated troponin may also be due to pulmonary emboli, aortic dissection, heart failure, trauma, toxins and ischemia in the setting of critical illness.   Delta 1 Hour 0    URINALYSIS W/MICRO REFLEX CULTURE - SYMPTOMATIC    Imaging: No data to display   ECG: ECG Results          ECG 12 lead (In process)      Collection Time Result Time Acquisition Device Ventricular Rate Atrial Rate P-R Interval QRS Duration Q-T Interval QTC Calculation(Bazett) Calculated P Axis Calculated R Axis Calculated T Axis   10/09/24 14:00:51 10/09/24 14:37:14 MV360 94 94 134 84 338 422 67 80 42          Collection Time Result Time ECGDiag   10/09/24 14:00:51 10/09/24 14:37:14 Normal sinus rhythm Nonspecific T wave abnormality   No previous ECGs available                            HEAR Score History: Mix of high & low features ECG: Normal Age: Less than 45 yrs Risk Factors: 1 or 2 risk factors HEAR Score Total: 2                                                           Pre-Sedation Procedures    Medical Decision Making Patient presents to the ED for chest pain.  Differential includes but not limited to the following: ACS, heart failure, PE/DVT, preeclampsia, asthma, COPD, help syndrome  Spoke with Dr. Dalila OB/GYN on-call, he wanted us  to rule out cardiac etiology prior to admission.  Stated that after that  was ruled out he was agreeable to admit.  Discussed this with my attending Dr. Prudy.   Troponins, CBC, CMP, magnesium, BNP, TSH, and UA ordered.  Troponins were flat at 1 hour 5.  EKG showed no signs of ST elevation or NSTEMI.  D-dimer was elevated at 0.82.  CTA pulm was considered.   Years algorithm clinical decision making tools used, Results below:  PE excluded YEARS algorithm rules out PE (0.43% with symptomatic VTE during 27-month follow-up  Discussed this with my attending MD recommended having shared decision making/discussion with patient.  After evaluating patient she stated that she would only want to have that done if absolutely necessary.    Discussed this with Dr.LaNeave and he agreed to admit patient.  He stated that holding on the CTA pulm was reasonable.  Unsure of what the patient's cause of chest pain is.  Lab work/workup is reassuring.  I did give patient fluids.  UA is pending.  However based on blood pressure and overall symptoms I have low suspicion for preeclampsia  Patient  left the floor hemodynamically stable afebrile and in no acute respiratory distress.  Vitals and lab work were reassuring.  Cardiac etiology effectively ruled out.     Amount and/or Complexity of Data Reviewed Labs: ordered. ECG/medicine tests: ordered.  Risk Decision regarding hospitalization.          Provider Communication  Current Discharge Medication List  Current Discharge Medication List      Current Discharge Medication List      Clinical Impression Final diagnoses:  Acute dyspnea  Encounter for suspected PROM, with rupture of membranes not found    ED Disposition     ED Disposition  Admit   Condition  --   Comment  --                   Electronically signed by:          [1] Social History Tobacco Use  Smoking Status Never  Smokeless Tobacco Never  [2] No Known Allergies  Norman Osgood, PA-C 10/09/24 1721

## 2024-10-09 NOTE — Telephone Encounter (Signed)
 Patient's boyfriend called triage to let us  know they are in the hospital.

## 2024-10-09 NOTE — Telephone Encounter (Signed)
 Patient's aunt Darice calling, patient was advised of CT scan with angiogram at [facility], and patient wants to know what her provider at our clinic would do? Per Jinnie Cookey, CNM patient should do/follow what the OB provider at [facility] recommends. Patients grandmother aware. Patient's grandmother updated # is [phone]

## 2024-10-09 NOTE — Telephone Encounter (Signed)
 Received call from patient's boyfriend (patient was present on speaker phone)  who says she was cooking breakfast this morning and her heart started to race.  She became very sweaty, her chest began hurting, and she was having trouble breathing.  The chest pain has subsided somewhat but she continues to feel some pain and SOB.  Boyfriend says he has already called EMS and they are on their way, but she wants to wait for her appointment tomorrow.  Advised this is an emergency and she needs to be seen in the hospital now.  Instructed them to go to the ED when EMS arrives and not try to wait for her OB appointment tomorrow.  He verbalized understanding and agreement.

## 2024-10-09 NOTE — ED Notes (Signed)
Pt. Not in room at this time.

## 2024-10-10 ENCOUNTER — Ambulatory Visit: Payer: Self-pay | Admitting: Certified Nurse Midwife

## 2024-10-10 VITALS — BP 103/70 | HR 89 | Wt 153.8 lb

## 2024-10-10 DIAGNOSIS — Z23 Encounter for immunization: Secondary | ICD-10-CM

## 2024-10-10 DIAGNOSIS — Z3480 Encounter for supervision of other normal pregnancy, unspecified trimester: Secondary | ICD-10-CM

## 2024-10-10 DIAGNOSIS — Z3A33 33 weeks gestation of pregnancy: Secondary | ICD-10-CM

## 2024-10-10 DIAGNOSIS — R309 Painful micturition, unspecified: Secondary | ICD-10-CM

## 2024-10-10 DIAGNOSIS — Z9229 Personal history of other drug therapy: Secondary | ICD-10-CM

## 2024-10-10 DIAGNOSIS — Z3483 Encounter for supervision of other normal pregnancy, third trimester: Secondary | ICD-10-CM

## 2024-10-10 NOTE — Assessment & Plan Note (Addendum)
 Felt SOB and palpitations while cooking breakfast yesterday. She went to the hospital and all emergency cardiac reasons ruled out. CT scan was negative. Abdominal US  was negative acute processes. She feels better than yesterday but still SOB but closer to baseline. Will order cardiac referral if symptoms persist.  Baby was transverse lie on US  yesterday at hospital. Feels cephalic with Leopolds today. Feels as though she is unable to empty bladder fully after she urinates. No pain or burning with urination. Some abdominal cramping.  Reviewed kick counts and preterm labor warning signs. Instructed to call office or come to hospital with persistent headache, vision changes, regular contractions, leaking of fluid, decreased fetal movement or vaginal bleeding.

## 2024-10-12 LAB — URINE CULTURE

## 2024-10-25 ENCOUNTER — Ambulatory Visit: Payer: Self-pay | Admitting: Obstetrics

## 2024-10-25 VITALS — BP 122/79 | HR 91 | Wt 159.0 lb

## 2024-10-25 DIAGNOSIS — Z3480 Encounter for supervision of other normal pregnancy, unspecified trimester: Secondary | ICD-10-CM

## 2024-10-25 NOTE — Assessment & Plan Note (Signed)
-  Discussed small, frequent meals and limiting eating before bedtime to reduce GERD symptoms. -Discussed travel this weekend, recommended finding delivery facility in [place]  and frequent stops to walk and void to prevent VTE and UTI. -Discussed plans for delivery facility regarding patient move - she still plans deliver at [facility]. Visitor policy questions answered. -Reviewed GBS swabs for next week. -Reviewed labor warning signs. Instructed to call office or come to hospital with persistent headache, vision changes, regular contractions, leaking of fluid, decreased fetal movement or vaginal bleeding.

## 2024-11-01 ENCOUNTER — Encounter: Admitting: Obstetrics

## 2024-11-02 ENCOUNTER — Encounter: Admitting: Certified Nurse Midwife

## 2024-11-09 ENCOUNTER — Encounter: Admitting: Certified Nurse Midwife

## 2024-11-17 ENCOUNTER — Encounter: Admitting: Certified Nurse Midwife
